# Patient Record
Sex: Male | Born: 1980
Health system: Southern US, Community
[De-identification: ages and names within clinical notes are randomized; demographics above are authoritative.]

## PROBLEM LIST (undated history)

## (undated) DIAGNOSIS — B009 Herpesviral infection, unspecified: Secondary | ICD-10-CM

## (undated) DIAGNOSIS — T7840XA Allergy, unspecified, initial encounter: Secondary | ICD-10-CM

## (undated) HISTORY — DX: Herpesviral infection, unspecified: B00.9

## (undated) HISTORY — DX: Allergy, unspecified, initial encounter: T78.40XA

---

## 2017-11-21 ENCOUNTER — Emergency Department (HOSPITAL_COMMUNITY): Payer: Self-pay

## 2017-11-21 ENCOUNTER — Emergency Department (HOSPITAL_COMMUNITY)
Admission: EM | Admit: 2017-11-21 | Discharge: 2017-11-21 | Discharge status: Against Medical Advice | Payer: Self-pay | Attending: Emergency Medicine | Admitting: Emergency Medicine

## 2017-11-21 ENCOUNTER — Encounter (HOSPITAL_COMMUNITY): Payer: Self-pay | Admitting: Neurology

## 2017-11-21 ENCOUNTER — Other Ambulatory Visit: Payer: Self-pay

## 2017-11-21 DIAGNOSIS — M25561 Pain in right knee: Secondary | ICD-10-CM | POA: Insufficient documentation

## 2017-11-21 DIAGNOSIS — Z5321 Procedure and treatment not carried out due to patient leaving prior to being seen by health care provider: Secondary | ICD-10-CM | POA: Insufficient documentation

## 2017-11-21 NOTE — ED Triage Notes (Signed)
Pt reports on Friday he fell in a man hole, c/o right knee pain, abrasion swelling to right shin. Lower back pain. Is ambulatory.

## 2017-11-21 NOTE — ED Notes (Signed)
Patient called x3 with no response °

## 2017-11-22 ENCOUNTER — Emergency Department
Admission: EM | Admit: 2017-11-22 | Discharge: 2017-11-22 | Disposition: A | Discharge status: Home / Self Care | Payer: Self-pay | Attending: Emergency Medicine | Admitting: Emergency Medicine

## 2017-11-22 ENCOUNTER — Other Ambulatory Visit: Payer: Self-pay

## 2017-11-22 ENCOUNTER — Encounter: Payer: Self-pay | Admitting: Emergency Medicine

## 2017-11-22 ENCOUNTER — Emergency Department: Payer: Self-pay

## 2017-11-22 DIAGNOSIS — Y999 Unspecified external cause status: Secondary | ICD-10-CM | POA: Insufficient documentation

## 2017-11-22 DIAGNOSIS — M545 Low back pain, unspecified: Secondary | ICD-10-CM

## 2017-11-22 DIAGNOSIS — S8991XA Unspecified injury of right lower leg, initial encounter: Secondary | ICD-10-CM | POA: Insufficient documentation

## 2017-11-22 DIAGNOSIS — W171XXA Fall into storm drain or manhole, initial encounter: Secondary | ICD-10-CM | POA: Insufficient documentation

## 2017-11-22 DIAGNOSIS — S8992XA Unspecified injury of left lower leg, initial encounter: Secondary | ICD-10-CM | POA: Insufficient documentation

## 2017-11-22 DIAGNOSIS — Y939 Activity, unspecified: Secondary | ICD-10-CM | POA: Insufficient documentation

## 2017-11-22 DIAGNOSIS — Y929 Unspecified place or not applicable: Secondary | ICD-10-CM | POA: Insufficient documentation

## 2017-11-22 DIAGNOSIS — W19XXXA Unspecified fall, initial encounter: Secondary | ICD-10-CM

## 2017-11-22 MED ORDER — CYCLOBENZAPRINE HCL 5 MG PO TABS: ORAL_TABLET | ORAL | 0 refills | Status: DC

## 2017-11-22 MED ORDER — IBUPROFEN 800 MG PO TABS: 800.0000 mg | ORAL_TABLET | Freq: Three times a day (TID) | ORAL | 0 refills | Status: DC | PRN

## 2017-11-22 NOTE — ED Provider Notes (Signed)
[facility] Emergency Department Provider Note  ____________________________________________  Time seen: Approximately 11:21 AM  I have reviewed the triage vital signs and the nursing notes.   HISTORY  Chief Complaint Fall    HPI Raymond Simmons is a 36 y.o. male that presents to the emergency department for evaluation of bilateral knee pain and low back pain after injury on Friday.  Patient states that he was going to the club in [place] when he fell in a manhole.  His right leg bent upward in his left foot leg went into the hole.  His left knee woke him up last night in his right knee woke him up the night before. His knees feel like they are grinding when he is walking. He is concerned that something is broken.  He has been taking ibuprofen for pain.  He did not hit his head or lose consciousness.  He denies nausea, vomiting, abdominal pain, numbness, tingling.  History reviewed. No pertinent past medical history.  There are no active problems to display for this patient.   History reviewed. No pertinent surgical history.  Prior to Admission medications   Medication Sig Start Date End Date Taking? Authorizing Provider  cyclobenzaprine (FLEXERIL) 5 MG tablet Take 1-2 tablets 3 times daily as needed 11/22/17   Enid Derry, PA-C  ibuprofen (ADVIL,MOTRIN) 800 MG tablet Take 1 tablet (800 mg total) by mouth every 8 (eight) hours as needed. 11/22/17   Enid Derry, PA-C    Allergies Patient has no known allergies.  No family history on file.  Social History Social History   Tobacco Use  . Smoking status: Never Smoker  . Smokeless tobacco: Never Used  Substance Use Topics  . Alcohol use: Yes  . Drug use: Not on file     Review of Systems  Cardiovascular: No chest pain. Respiratory: No SOB. Gastrointestinal: No abdominal pain.  No nausea, no vomiting.  Skin: Negative for rash, abrasions, lacerations, ecchymosis. Neurological: Negative for  headaches, numbness or tingling   ____________________________________________   PHYSICAL EXAM:  VITAL SIGNS: ED Triage Vitals  Enc Vitals Group     BP 11/22/17 1045 112/62     Pulse Rate 11/22/17 1043 74     Resp 11/22/17 1043 18     Temp 11/22/17 1043 98.6 F (37 C)     Temp Source 11/22/17 1043 Oral     SpO2 11/22/17 1043 99 %     Weight 11/22/17 1043 162 lb (73.5 kg)     Height --      Head Circumference --      Peak Flow --      Pain Score 11/22/17 1043 8     Pain Loc --      Pain Edu? --      Excl. in GC? --      Constitutional: Alert and oriented. Well appearing and in no acute distress. Eyes: Conjunctivae are normal. PERRL. EOMI. Head: Atraumatic. ENT:      Ears:      Nose: No congestion/rhinnorhea.      Mouth/Throat: Mucous membranes are moist.  Neck: No stridor.  Cardiovascular: Normal rate, regular rhythm.  Good peripheral circulation. Respiratory: Normal respiratory effort without tachypnea or retractions. Lungs CTAB. Good air entry to the bases with no decreased or absent breath sounds. Gastrointestinal: Bowel sounds 4 quadrants. Soft and nontender to palpation. No guarding or rigidity. No palpable masses. No distention.  Musculoskeletal: Full range of motion to all extremities. No gross deformities appreciated.  Mild tenderness to palpation of inferior spine. Full ROM of bilateral knees. No pinpoint tenderness to palpation of knees. No swelling or erythema to knees. Normal gait.  Neurologic:  Normal speech and language. No gross focal neurologic deficits are appreciated.  Skin:  Skin is warm, dry and intact. No rash noted.   ____________________________________________   LABS (all labs ordered are listed, but only abnormal results are displayed)  Labs Reviewed - No data to display ____________________________________________  EKG   ____________________________________________  RADIOLOGY Lexine BatonI, Annmarie Plemmons, personally viewed and evaluated these  images (plain radiographs) as part of my medical decision making, as well as reviewing the written report by the radiologist.  Dg Lumbar Spine Complete  Result Date: 11/22/2017 CLINICAL DATA:  Status post fall. EXAM: LUMBAR SPINE - COMPLETE 4+ VIEW COMPARISON:  None. FINDINGS: There are 5 nonrib bearing lumbar-type vertebral bodies. The vertebral body heights are maintained. The alignment is anatomic. There is no static listhesis. There is no spondylolysis. There is no acute fracture. There is mild degenerative disc disease with disc height loss at L5-S1. The SI joints are unremarkable. There are multiple metallic foreign bodies anterior to the sacrum likely reflecting shrapnel from prior injury. IMPRESSION: 1.  No acute osseous injury of the lumbar spine. Electronically Signed   By: Elige KoHetal  Patel   On: 11/22/2017 12:08   Dg Knee Complete 4 Views Left  Result Date: 11/22/2017 CLINICAL DATA:  Fall.  Knee pain.  Back pain. EXAM: LEFT KNEE - COMPLETE 4+ VIEW COMPARISON:  11/21/2017. FINDINGS: Mild degenerative change left knee. No evidence of fracture or dislocation. IMPRESSION: No acute abnormality. Electronically Signed   By: Maisie Fushomas  Register   On: 11/22/2017 12:05   Dg Knee Complete 4 Views Right  Result Date: 11/22/2017 CLINICAL DATA:  Fall and mandible. EXAM: RIGHT KNEE - COMPLETE 4+ VIEW COMPARISON:  11/21/2017 FINDINGS: No evidence of fracture, dislocation, or joint effusion. No evidence of arthropathy or other focal bone abnormality. Soft tissues are unremarkable. IMPRESSION: Negative. Electronically Signed   By: Signa Kellaylor  Stroud M.D.   On: 11/22/2017 12:05    ____________________________________________    PROCEDURES  Procedure(s) performed:    Procedures    Medications - No data to display   ____________________________________________   INITIAL IMPRESSION / ASSESSMENT AND PLAN / ED COURSE  Pertinent labs & imaging results that were available during my care of the patient  were reviewed by me and considered in my medical decision making (see chart for details).  Review of the [place] CSRS was performed in accordance of the [facility] prior to dispensing any controlled drugs.   Patient presented to the emergency department for evaluation of bilateral knee pain and low back pain after fall on 4 days ago.  Vital signs and exam are reassuring.  Knee x-rays and lumbar x-rays are negative for acute bony abnormalities.  Patient does not want any IM medications.  He was given crutches.  Patient will be discharged home with prescriptions for ibuprofen and Flexeril. Patient is to follow up with PCP as directed. Patient is given ED precautions to return to the ED for any worsening or new symptoms.     ____________________________________________  FINAL CLINICAL IMPRESSION(S) / ED DIAGNOSES  Final diagnoses:  Fall, initial encounter  Injury of left knee, initial encounter  Injury of right knee, initial encounter  Acute midline low back pain without sciatica      NEW MEDICATIONS STARTED DURING THIS VISIT:  ED Discharge Orders  Ordered    ibuprofen (ADVIL,MOTRIN) 800 MG tablet  Every 8 hours PRN     11/22/17 1234    cyclobenzaprine (FLEXERIL) 5 MG tablet     11/22/17 1234          This chart was dictated using voice recognition software/Dragon. Despite best efforts to proofread, errors can occur which can change the meaning. Any change was purely unintentional.    Enid DerryWagner, Arti Trang, PA-C 11/22/17 1335    Darci CurrentBrown, [place], MD 11/22/17 [phone]

## 2017-11-22 NOTE — ED Notes (Signed)
Pt returned from xray at this time.

## 2017-11-22 NOTE — ED Notes (Signed)
Patient transported to X-ray 

## 2017-11-22 NOTE — ED Triage Notes (Signed)
Pt states fell in an man hole on Friday and now having back pain and right knee pain.

## 2019-06-04 ENCOUNTER — Telehealth: Payer: Self-pay | Admitting: *Deleted

## 2019-06-04 NOTE — Telephone Encounter (Signed)
Left voicemail for him to call us back to be scheduled for his COVID-19 test at [phone] and any of the nurses could assist him.

## 2019-10-24 ENCOUNTER — Other Ambulatory Visit: Payer: Self-pay

## 2019-10-24 ENCOUNTER — Encounter: Payer: Self-pay | Admitting: *Deleted

## 2019-10-24 ENCOUNTER — Emergency Department: Payer: Self-pay

## 2019-10-24 DIAGNOSIS — X500XXA Overexertion from strenuous movement or load, initial encounter: Secondary | ICD-10-CM | POA: Insufficient documentation

## 2019-10-24 DIAGNOSIS — R0789 Other chest pain: Secondary | ICD-10-CM | POA: Insufficient documentation

## 2019-10-24 DIAGNOSIS — Y9389 Activity, other specified: Secondary | ICD-10-CM | POA: Insufficient documentation

## 2019-10-24 LAB — CBC
HCT: 37.3 % — ABNORMAL LOW (ref 39.0–52.0)
Hemoglobin: 12.1 g/dL — ABNORMAL LOW (ref 13.0–17.0)
MCH: 23.5 pg — ABNORMAL LOW (ref 26.0–34.0)
MCHC: 32.4 g/dL (ref 30.0–36.0)
MCV: 72.6 fL — ABNORMAL LOW (ref 80.0–100.0)
Platelets: 240 10*3/uL (ref 150–400)
RBC: 5.14 MIL/uL (ref 4.22–5.81)
RDW: 14.6 % (ref 11.5–15.5)
WBC: 3.9 10*3/uL — ABNORMAL LOW (ref 4.0–10.5)
nRBC: 0 % (ref 0.0–0.2)

## 2019-10-24 LAB — BASIC METABOLIC PANEL
Anion gap: 10 (ref 5–15)
BUN: 15 mg/dL (ref 6–20)
CO2: 26 mmol/L (ref 22–32)
Calcium: 8.9 mg/dL (ref 8.9–10.3)
Chloride: 104 mmol/L (ref 98–111)
Creatinine, Ser: 0.94 mg/dL (ref 0.61–1.24)
GFR calc Af Amer: >60 mL/min (ref >60)
GFR calc non Af Amer: >60 mL/min (ref >60)
Glucose, Bld: 79 mg/dL (ref 70–99)
Potassium: 3.7 mmol/L (ref 3.5–5.1)
Sodium: 140 mmol/L (ref 135–145)

## 2019-10-24 LAB — TROPONIN I (HIGH SENSITIVITY)
Troponin I (High Sensitivity): 3 ng/L (ref <18)
Troponin I (High Sensitivity): 3 ng/L (ref <18)

## 2019-10-24 MED ORDER — SODIUM CHLORIDE 0.9% FLUSH: 3.0000 mL | Freq: Once | INTRAVENOUS | Status: DC

## 2019-10-24 NOTE — ED Triage Notes (Signed)
Pt reporting central "chest tightness" for about 3-4 days, worse when he lays down. When the chest pain occurs, he also has pain in the middle of his upper back. Denies other symptoms. No cough, fevers, or SOB.

## 2019-10-24 NOTE — ED Notes (Signed)
Pt sitting in lobby talking on phone with no distress noted; pt updated on wait time & plan of care; pt voices understanding and taken to triage for repeat vs and troponin

## 2019-10-25 ENCOUNTER — Emergency Department
Admission: EM | Admit: 2019-10-25 | Discharge: 2019-10-25 | Disposition: A | Discharge status: Home / Self Care | Payer: Self-pay | Attending: Emergency Medicine | Admitting: Emergency Medicine

## 2019-10-25 DIAGNOSIS — R0789 Other chest pain: Secondary | ICD-10-CM

## 2019-10-25 LAB — HEPATIC FUNCTION PANEL
ALT: 27 U/L (ref 0–44)
AST: 29 U/L (ref 15–41)
Albumin: 3.9 g/dL (ref 3.5–5.0)
Alkaline Phosphatase: 44 U/L (ref 38–126)
Bilirubin, Direct: <0.1 mg/dL (ref 0.0–0.2)
Total Bilirubin: 0.6 mg/dL (ref 0.3–1.2)
Total Protein: 6.6 g/dL (ref 6.5–8.1)

## 2019-10-25 LAB — LIPASE, BLOOD: Lipase: 29 U/L (ref 11–51)

## 2019-10-25 NOTE — ED Provider Notes (Signed)
[facility] Emergency Department Provider Note  ____________________________________________   First MD Initiated Contact with Patient 10/25/19 0125     (approximate)  I have reviewed the triage vital signs and the nursing notes.   HISTORY  Chief Complaint Chest Pain    HPI Raymond AmisJason Simmons is a 38 y.o. male with no chronic medical issues who is generally healthy and active.  He presents for evaluation of intermittent pain in the center part of his chest over the last 3 to 4 days.  Occasionally radiates to the middle of his back.  It occurs more when he lies down after the end of his day.  It hurts a little bit when he presses on the center of his chest (his sternum).  He does not lift weights and has no known trauma but he works in [employer] and does a lot of lifting and moving.  He denies fever/chills, sore throat, shortness of breath, cough, nausea, vomiting, abdominal pain.  He has had no known COVID-19 contacts although he does work in [employer].  No history of diabetes, hypertension, obesity, blood clots in the legs of the lungs, smoking, or drug use.  He describes the symptoms as moderate and nothing in particular makes it better.  He has not tried taking any ibuprofen, Tylenol, nor aspirin.         History reviewed. No pertinent past medical history.  There are no active problems to display for this patient.   History reviewed. No pertinent surgical history.  Prior to Admission medications   Medication Sig Start Date End Date Taking? Authorizing Provider  cyclobenzaprine (FLEXERIL) 5 MG tablet Take 1-2 tablets 3 times daily as needed 11/22/17   Enid DerryWagner, Ashley, PA-C  ibuprofen (ADVIL,MOTRIN) 800 MG tablet Take 1 tablet (800 mg total) by mouth every 8 (eight) hours as needed. 11/22/17   Enid DerryWagner, Ashley, PA-C    Allergies Patient has no known allergies.  No family history on file.  Social History Social History   Tobacco Use   . Smoking status: Never Smoker  . Smokeless tobacco: Never Used  Substance Use Topics  . Alcohol use: Yes  . Drug use: Not Currently    Review of Systems Constitutional: No fever/chills Eyes: No visual changes. ENT: No sore throat. Cardiovascular: Chest pain as described above Respiratory: Denies shortness of breath. Gastrointestinal: No abdominal pain.  No nausea, no vomiting.  No diarrhea.  No constipation. Genitourinary: Negative for dysuria. Musculoskeletal: Negative for neck pain.  Negative for back pain. Integumentary: Negative for rash. Neurological: Negative for headaches, focal weakness or numbness.   ____________________________________________   PHYSICAL EXAM:  VITAL SIGNS: ED Triage Vitals  Enc Vitals Group     BP 10/24/19 2026 119/76     Pulse Rate 10/24/19 2026 61     Resp 10/24/19 2026 16     Temp 10/24/19 2026 98.9 F (37.2 C)     Temp Source 10/24/19 2026 Oral     SpO2 10/24/19 2026 100 %     Weight 10/24/19 2020 70.3 kg (155 lb)     Height 10/24/19 2020 1.753 m (5\' 9" )     Head Circumference --      Peak Flow --      Pain Score 10/24/19 2020 8     Pain Loc --      Pain Edu? --      Excl. in GC? --     Constitutional: Alert and oriented.  Healthy body habitus,  well appearing, no acute distress. Eyes: Conjunctivae are normal.  Head: Atraumatic. Nose: No congestion/rhinnorhea. Mouth/Throat: Patient is wearing a mask. Neck: No stridor.  No meningeal signs.   Cardiovascular: Normal rate, regular rhythm. Good peripheral circulation. Grossly normal heart sounds. Respiratory: Normal respiratory effort.  No retractions. Gastrointestinal: Soft and nontender. No distention.  Musculoskeletal: Mild tenderness to palpation of the sternum, easily reproducible with palpation.  No lower extremity tenderness nor edema. No gross deformities of extremities. Neurologic:  Normal speech and language. No gross focal neurologic deficits are appreciated.  Skin:  Skin  is warm, dry and intact. Psychiatric: Mood and affect are normal. Speech and behavior are normal.  ____________________________________________   LABS (all labs ordered are listed, but only abnormal results are displayed)  Labs Reviewed  CBC - Abnormal; Notable for the following components:      Result Value   WBC 3.9 (*)    Hemoglobin 12.1 (*)    HCT 37.3 (*)    MCV 72.6 (*)    MCH 23.5 (*)    All other components within normal limits  BASIC METABOLIC PANEL  HEPATIC FUNCTION PANEL  LIPASE, BLOOD  TROPONIN I (HIGH SENSITIVITY)  TROPONIN I (HIGH SENSITIVITY)   ____________________________________________  EKG  ED ECG REPORT I, Hinda Kehr, the attending physician, personally viewed and interpreted this ECG.  Date: 10/24/2019 EKG Time: 20: 22 Rate: 61 Rhythm: normal sinus rhythm QRS Axis: Right axis deviation Intervals: normal ST/T Wave abnormalities: normal Narrative Interpretation: no evidence of acute ischemia  ____________________________________________  RADIOLOGY I, Hinda Kehr, personally viewed and evaluated these images (plain radiographs) as part of my medical decision making, as well as reviewing the written report by the radiologist.  ED MD interpretation: No acute abnormalities on x-ray  Official radiology report(s): Dg Chest 2 View  Result Date: 10/24/2019 CLINICAL DATA:  Chest pain that the patient describes as "chest tightness" for 3-4 days. EXAM: CHEST - 2 VIEW COMPARISON:  None. FINDINGS: The heart size and mediastinal contours are within normal limits. Both lungs are clear. The visualized skeletal structures are unremarkable. IMPRESSION: No active cardiopulmonary disease. Electronically Signed   By: Van Clines M.D.   On: 10/24/2019 20:38    ____________________________________________   PROCEDURES   Procedure(s) performed (including Critical Care):  Procedures   ____________________________________________   INITIAL  IMPRESSION / MDM / [place] / ED COURSE  As part of my medical decision making, I reviewed the following data within the [place] notes reviewed and incorporated, Labs reviewed , EKG interpreted , Old chart reviewed, Radiograph reviewed  and Notes from prior ED visits   Differential diagnosis includes, but is not limited to, musculoskeletal chest wall pain, costochondritis, pericarditis/myocarditis, ACS, PE, pneumonia.  The patient is well-appearing and in no distress.  He has been in the ED for 5 hours and said that he feels fine.  He has had 2 - high-sensitivity troponins.  Basic metabolic panel is within normal limits.  He has a very slightly decreased white blood cell count of 3.9 but otherwise it is unremarkable.  No abnormal findings on EKG nor chest x-ray. Low risk HEAR score, PERC negative.  Reproducible sternal tenderness.  I had my usual and customary chest wall tenderness and nonspecific chest pain discussion with the patient.  I recommended ibuprofen 600 mg 3 times a day with meals for the next 5 days and will give him information about outpatient follow-up as needed.  I gave my usual customary return  precautions and he understands and agrees with the plan.   ____________________________________________  FINAL CLINICAL IMPRESSION(S) / ED DIAGNOSES  Final diagnoses:  Chest wall pain     MEDICATIONS GIVEN DURING THIS VISIT:  Medications - No data to display   ED Discharge Orders    None      *Please note:  Leodan Bolyard was evaluated in Emergency Department on 10/25/2019 for the symptoms described in the history of present illness. He was evaluated in the context of the global COVID-19 pandemic, which necessitated consideration that the patient might be at risk for infection with the SARS-CoV-2 virus that causes COVID-19. Institutional protocols and algorithms that pertain to the evaluation of patients at risk for COVID-19 are in a state  of rapid change based on information released by regulatory bodies including the CDC and federal and state organizations. These policies and algorithms were followed during the patient's care in the ED.  Some ED evaluations and interventions may be delayed as a result of limited staffing during the pandemic.*  Note:  This document was prepared using Dragon voice recognition software and may include unintentional dictation errors.   Loleta Rose, MD 10/25/19 [phone]

## 2019-10-25 NOTE — Discharge Instructions (Signed)

## 2020-02-01 ENCOUNTER — Ambulatory Visit (INDEPENDENT_AMBULATORY_CARE_PROVIDER_SITE_OTHER): Payer: BC Managed Care – PPO | Admitting: Nurse Practitioner

## 2020-02-01 ENCOUNTER — Other Ambulatory Visit: Payer: Self-pay

## 2020-02-01 VITALS — BP 102/65 | HR 63 | Temp 98.3°F | Ht 68.0 in | Wt 160.2 lb

## 2020-02-01 DIAGNOSIS — R252 Cramp and spasm: Secondary | ICD-10-CM

## 2020-02-01 DIAGNOSIS — D649 Anemia, unspecified: Secondary | ICD-10-CM | POA: Diagnosis not present

## 2020-02-01 DIAGNOSIS — D531 Other megaloblastic anemias, not elsewhere classified: Secondary | ICD-10-CM | POA: Diagnosis not present

## 2020-02-01 NOTE — Addendum Note (Signed)
Addended by: Bascom Levels on: 02/01/2020 04:48 PM   Modules accepted: Orders

## 2020-02-01 NOTE — Assessment & Plan Note (Signed)
Etiology unclear, CBC in ED about 3 months ago showed low hemoglobin, hematocrit, MCV, and MCH.  Will recheck CBC with diff today and add anemia panel.

## 2020-02-01 NOTE — Patient Instructions (Signed)
Leg Cramps Leg cramps occur when one or more muscles tighten and you have no control over this tightening (involuntary muscle contraction). Muscle cramps can develop in any muscle, but the most common place is in the calf muscles of the leg. Those cramps can occur during exercise or when you are at rest. Leg cramps are painful, and they may last for a few seconds to a few minutes. Cramps may return several times before they finally stop. Usually, leg cramps are not caused by a serious medical problem. In many cases, the cause is not known. Some common causes include:  Excessive physical effort (overexertion), such as during intense exercise.  Overuse from repetitive motions, or doing the same thing over and over.  Staying in a certain position for a long period of time.  Improper preparation, form, or technique while performing a sport or an activity.  Dehydration.  Injury.  Side effects of certain medicines.  Abnormally low levels of minerals in your blood (electrolytes), especially potassium and calcium. This could result from: ? Pregnancy. ? Taking diuretic medicines. Follow these instructions at home: Eating and drinking  Drink enough fluid to keep your urine pale yellow. Staying hydrated may help prevent cramps.  Eat a healthy diet that includes plenty of nutrients to help your muscles function. A healthy diet includes fruits and vegetables, lean protein, whole grains, and low-fat or nonfat dairy products. Managing pain, stiffness, and swelling      Try massaging, stretching, and relaxing the affected muscle. Do this for several minutes at a time.  If directed, put ice on areas that are sore or painful after a cramp: ? Put ice in a plastic bag. ? Place a towel between your skin and the bag. ? Leave the ice on for 20 minutes, 2-3 times a day.  If directed, apply heat to muscles that are tense or tight. Do this before you exercise, or as often as told by your health care  provider. Use the heat source that your health care provider recommends, such as a moist heat pack or a heating pad. ? Place a towel between your skin and the heat source. ? Leave the heat on for 20-30 minutes. ? Remove the heat if your skin turns bright red. This is especially important if you are unable to feel pain, heat, or cold. You may have a greater risk of getting burned.  Try taking hot showers or baths to help relax tight muscles. General instructions  If you are having frequent leg cramps, avoid intense exercise for several days.  Take over-the-counter and prescription medicines only as told by your health care provider.  Keep all follow-up visits as told by your health care provider. This is important. Contact a health care provider if:  Your leg cramps get more severe or more frequent, or they do not improve over time.  Your foot becomes cold, numb, or blue. Summary  Muscle cramps can develop in any muscle, but the most common place is in the calf muscles of the leg.  Leg cramps are painful, and they may last for a few seconds to a few minutes.  Usually, leg cramps are not caused by a serious medical problem. Often, the cause is not known.  Stay hydrated and take over-the-counter and prescription medicines only as told by your health care provider. This information is not intended to replace advice given to you by your health care provider. Make sure you discuss any questions you have with your health care   provider. Document Revised: 11/04/2017 Document Reviewed: 09/01/2017 Elsevier Patient Education  2020 [employer]  

## 2020-02-01 NOTE — Progress Notes (Signed)
BP 102/65 (BP Location: Left Arm, Patient Position: Sitting, Cuff Size: Normal)   Pulse 63   Temp 98.3 F (36.8 C) (Oral)   Ht 5\' 8"  (1.727 m)   Wt 160 lb 3.2 oz (72.7 kg)   SpO2 97%   BMI 24.36 kg/m    Subjective:    Patient ID: Raymond Simmons, male    DOB: 24-Feb-1981, 39 y.o.   MRN: [id number]  HPI: Raymond Simmons is a 39 y.o. male presents for new patient visit to establish care.  Introduced to 39 role and practice setting.  All questions answered.  Chief Complaint  Patient presents with  . Establish Care  . Leg Pain   LEG CRAMPS Duration: chronic Pain: yes Severity: 7/10  Quality: cramping   Location:  calves and right thigh Bilateral:  yes Onset: sudden Frequency: intermittent 2-3 Time of  day:   at random Sudden unintentional leg jerking:   no Paresthesias:   yes; in toes Decreased sensation:  no Weakness:   no Insomnia:   yes; related to working 2 jobs Fatigue:   yes Alleviating factors: taking warm baths Aggravating factors: nothing, its random times Status: fluctuating Treatments attempted: none Reports he does not drink as much water as he probably should and thinks that could be the cause of the cramping.  Patient reports he takes a lot of warm baths.   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    Depression screen [facility] 2/9 02/01/2020  Decreased Interest 0  Down, Depressed, Hopeless 0  PHQ - 2 Score 0  Altered sleeping 0  Tired, decreased energy 1  Change in appetite 1  Feeling bad or failure about yourself  0  Trouble concentrating 0  Moving slowly or fidgety/restless 0  Suicidal thoughts 0  PHQ-9 Score 2  Difficult doing work/chores Not difficult at all   No Known Allergies  Outpatient Encounter Medications as of 02/01/2020  Medication Sig  . [DISCONTINUED] cyclobenzaprine (FLEXERIL) 5 MG tablet Take 1-2 tablets 3 times daily as needed  . [DISCONTINUED]  ibuprofen (ADVIL,MOTRIN) 800 MG tablet Take 1 tablet (800 mg total) by mouth every 8 (eight) hours as needed.   No facility-administered encounter medications on file as of 02/01/2020.   Active Ambulatory Problems    Diagnosis Date Noted  . Leg cramping 02/01/2020  . Anemia 02/01/2020   Resolved Ambulatory Problems    Diagnosis Date Noted  . No Resolved Ambulatory Problems   Past Medical History:  Diagnosis Date  . Allergies   . Herpes    Past Medical History:  Diagnosis Date  . Allergies   . Herpes    History reviewed. No pertinent surgical history.  Family History  Problem Relation Age of Onset  . Prostate cancer Maternal Uncle   . HIV Mother   . Aneurysm Father   . Hypertension Maternal Grandmother   . Diabetes Maternal Grandmother     Social History   Socioeconomic History  . Marital status: Single    Spouse name: Not on file  . Number of children: Not on file  . Years of education: Not on file  . Highest education level: Not on file  Occupational History  . Not on file  Tobacco Use  . Smoking status: Former Smoker    Types: Cigarettes  . Smokeless tobacco: Never Used  Substance and Sexual Activity  . Alcohol use: Yes    Comment: Occassionally  . Drug  use: Not Currently    Types: Marijuana    Comment: Has not used Marijuana in 20 years.  . Sexual activity: Yes    Partners: Female    Birth control/protection: Condom  Other Topics Concern  . Not on file  Social History Narrative  . Not on file   Social Determinants of Health   Financial Resource Strain:   . Difficulty of Paying Living Expenses: Not on file  Food Insecurity:   . Worried About Charity fundraiser in the Last Year: Not on file  . Ran Out of Food in the Last Year: Not on file  Transportation Needs:   . Lack of Transportation (Medical): Not on file  . Lack of Transportation (Non-Medical): Not on file  Physical Activity:   . Days of Exercise per Week: Not on file  . Minutes of  Exercise per Session: Not on file  Stress:   . Feeling of Stress : Not on file  Social Connections:   . Frequency of Communication with Friends and Family: Not on file  . Frequency of Social Gatherings with Friends and Family: Not on file  . Attends Religious Services: Not on file  . Active Member of Clubs or Organizations: Not on file  . Attends Archivist Meetings: Not on file  . Marital Status: Not on file   Review of Systems  Constitutional: Positive for fatigue. Negative for activity change, appetite change and fever.  Respiratory: Negative.   Cardiovascular: Negative.   Gastrointestinal: Negative.   Genitourinary: Negative.   Musculoskeletal: Positive for myalgias. Negative for back pain, gait problem, neck pain and neck stiffness.  Skin: Negative.  Negative for color change and rash.  Neurological: Negative.  Negative for dizziness, weakness, light-headedness, numbness and headaches.  Psychiatric/Behavioral: Positive for sleep disturbance. Negative for agitation, confusion, decreased concentration and hallucinations. The patient is not nervous/anxious.    Per HPI unless specifically indicated above    Objective:    BP 102/65 (BP Location: Left Arm, Patient Position: Sitting, Cuff Size: Normal)   Pulse 63   Temp 98.3 F (36.8 C) (Oral)   Ht 5\' 8"  (1.727 m)   Wt 160 lb 3.2 oz (72.7 kg)   SpO2 97%   BMI 24.36 kg/m   Wt Readings from Last 3 Encounters:  02/01/20 160 lb 3.2 oz (72.7 kg)  10/24/19 155 lb (70.3 kg)  11/22/17 162 lb (73.5 kg)    Physical Exam Vitals and nursing note reviewed.  Constitutional:      General: He is not in acute distress.    Appearance: Normal appearance.  HENT:     Head: Normocephalic and atraumatic.  Cardiovascular:     Rate and Rhythm: Normal rate.     Pulses: Normal pulses.  Pulmonary:     Effort: Pulmonary effort is normal. No respiratory distress.     Breath sounds: Normal breath sounds. No wheezing or rhonchi.   Abdominal:     General: Abdomen is flat. Bowel sounds are normal. There is no distension.     Palpations: Abdomen is soft.     Tenderness: There is no abdominal tenderness.  Musculoskeletal:        General: No swelling. Normal range of motion.  Skin:    General: Skin is warm and dry.     Coloration: Skin is not jaundiced or pale.  Neurological:     General: No focal deficit present.     Mental Status: He is alert and oriented to person, place,  and time.     Motor: No weakness.     Gait: Gait normal.  Psychiatric:        Mood and Affect: Mood normal.        Behavior: Behavior normal.        Thought Content: Thought content normal.        Judgment: Judgment normal.       Assessment & Plan:   Problem List Items Addressed This Visit      Other   Leg cramping - Primary    Ongoing.  Will obtain metabolic panel and check complete blood count with anemia panel; last blood work ~3 months ago in ED showed anemia.  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  Encouraged to increase daily water intake to eight 8oz glasses or bottles.  Advised to go to ED with any sudden onset pain, redness, streaking, or swelling to either lower extremity.        Relevant Orders   Comprehensive metabolic panel   CBC with Differential/Platelet   US Venous Img Lower Bilateral   Anemia    Etiology unclear, CBC in ED about 3 months ago showed low hemoglobin, hematocrit, MCV, and MCH.  Will recheck CBC with diff today and add anemia panel.      Relevant Orders   Anemia panel      Follow up plan: Return in about 4 weeks (around 02/29/2020) for CPE with labs.

## 2020-02-01 NOTE — Assessment & Plan Note (Signed)
Ongoing.  Will obtain metabolic panel and check complete blood count with anemia panel; last blood work ~3 months ago in ED showed anemia.  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  Encouraged to increase daily water intake to eight 8oz glasses or bottles.  Advised to go to ED with any sudden onset pain, redness, streaking, or swelling to either lower extremity.

## 2020-02-02 ENCOUNTER — Encounter: Payer: Self-pay | Admitting: Nurse Practitioner

## 2020-02-03 LAB — CBC WITH DIFFERENTIAL/PLATELET
Basophils Absolute: 0 10*3/uL (ref 0.0–0.2)
Basos: 1 %
EOS (ABSOLUTE): 0.3 10*3/uL (ref 0.0–0.4)
Eos: 7 %
Hemoglobin: 13.7 g/dL (ref 13.0–17.7)
Immature Grans (Abs): 0 10*3/uL (ref 0.0–0.1)
Immature Granulocytes: 0 %
Lymphocytes Absolute: 1.8 10*3/uL (ref 0.7–3.1)
Lymphs: 42 %
MCH: 24.6 pg — ABNORMAL LOW (ref 26.6–33.0)
MCHC: 32.7 g/dL (ref 31.5–35.7)
MCV: 75 fL — ABNORMAL LOW (ref 79–97)
Monocytes Absolute: 0.3 10*3/uL (ref 0.1–0.9)
Monocytes: 8 %
Neutrophils Absolute: 1.8 10*3/uL (ref 1.4–7.0)
Neutrophils: 42 %
Platelets: 242 10*3/uL (ref 150–450)
RBC: 5.57 x10E6/uL (ref 4.14–5.80)
RDW: 14.8 % (ref 11.6–15.4)
WBC: 4.3 10*3/uL (ref 3.4–10.8)

## 2020-02-03 LAB — ANEMIA PANEL
Ferritin: 130 ng/mL (ref 30–400)
Folate, Hemolysate: 339 ng/mL
Folate, RBC: 809 ng/mL (ref >498)
Hematocrit: 41.9 % (ref 37.5–51.0)
Iron Saturation: 17 % (ref 15–55)
Iron: 55 ug/dL (ref 38–169)
Retic Ct Pct: 0.9 % (ref 0.6–2.6)
Total Iron Binding Capacity: 328 ug/dL (ref 250–450)
UIBC: 273 ug/dL (ref 111–343)
Vitamin B-12: 256 pg/mL (ref 232–1245)

## 2020-02-03 LAB — COMPREHENSIVE METABOLIC PANEL
ALT: 20 IU/L (ref 0–44)
AST: 21 IU/L (ref 0–40)
Albumin/Globulin Ratio: 1.9 (ref 1.2–2.2)
Albumin: 4.5 g/dL (ref 4.0–5.0)
Alkaline Phosphatase: 48 IU/L (ref 39–117)
BUN/Creatinine Ratio: 19 (ref 9–20)
BUN: 20 mg/dL (ref 6–24)
Bilirubin Total: 0.2 mg/dL (ref 0.0–1.2)
CO2: 23 mmol/L (ref 20–29)
Calcium: 9.3 mg/dL (ref 8.7–10.2)
Chloride: 104 mmol/L (ref 96–106)
Creatinine, Ser: 1.04 mg/dL (ref 0.76–1.27)
GFR calc Af Amer: 103 mL/min/{1.73_m2} (ref >59)
GFR calc non Af Amer: 89 mL/min/{1.73_m2} (ref >59)
Globulin, Total: 2.4 g/dL (ref 1.5–4.5)
Glucose: 78 mg/dL (ref 65–99)
Potassium: 4.5 mmol/L (ref 3.5–5.2)
Sodium: 140 mmol/L (ref 134–144)
Total Protein: 6.9 g/dL (ref 6.0–8.5)

## 2020-02-05 ENCOUNTER — Other Ambulatory Visit: Payer: Self-pay

## 2020-02-05 ENCOUNTER — Ambulatory Visit
Admission: RE | Admit: 2020-02-05 | Discharge: 2020-02-05 | Disposition: A | Discharge status: Home / Self Care | Payer: BC Managed Care – PPO | Source: Ambulatory Visit | Attending: Nurse Practitioner | Admitting: Nurse Practitioner

## 2020-02-05 DIAGNOSIS — R252 Cramp and spasm: Secondary | ICD-10-CM | POA: Diagnosis not present

## 2020-02-05 DIAGNOSIS — M79605 Pain in left leg: Secondary | ICD-10-CM | POA: Diagnosis not present

## 2020-02-05 DIAGNOSIS — M79604 Pain in right leg: Secondary | ICD-10-CM | POA: Diagnosis not present

## 2020-02-08 ENCOUNTER — Encounter: Payer: Self-pay | Admitting: Nurse Practitioner

## 2020-02-08 ENCOUNTER — Ambulatory Visit (INDEPENDENT_AMBULATORY_CARE_PROVIDER_SITE_OTHER): Payer: BC Managed Care – PPO | Admitting: Nurse Practitioner

## 2020-02-08 ENCOUNTER — Other Ambulatory Visit: Payer: Self-pay

## 2020-02-08 VITALS — BP 107/69 | HR 64 | Temp 98.1°F | Ht 68.0 in | Wt 161.8 lb

## 2020-02-08 DIAGNOSIS — Z0000 Encounter for general adult medical examination without abnormal findings: Secondary | ICD-10-CM

## 2020-02-08 DIAGNOSIS — Z13220 Encounter for screening for lipoid disorders: Secondary | ICD-10-CM | POA: Diagnosis not present

## 2020-02-08 DIAGNOSIS — Z1329 Encounter for screening for other suspected endocrine disorder: Secondary | ICD-10-CM

## 2020-02-08 DIAGNOSIS — Z125 Encounter for screening for malignant neoplasm of prostate: Secondary | ICD-10-CM

## 2020-02-08 DIAGNOSIS — Z139 Encounter for screening, unspecified: Secondary | ICD-10-CM | POA: Diagnosis not present

## 2020-02-08 DIAGNOSIS — Z113 Encounter for screening for infections with a predominantly sexual mode of transmission: Secondary | ICD-10-CM | POA: Diagnosis not present

## 2020-02-08 DIAGNOSIS — Z23 Encounter for immunization: Secondary | ICD-10-CM

## 2020-02-08 DIAGNOSIS — R8281 Pyuria: Secondary | ICD-10-CM | POA: Diagnosis not present

## 2020-02-08 NOTE — Patient Instructions (Addendum)
It was very nice to see you again!  We will let you know about the results of your labs on Monday.  Take care and let us know if you need anything.  Td (Tetanus, Diphtheria) Vaccine: What You Need to Know 1. Why get vaccinated? Td vaccine can prevent tetanus and diphtheria. Tetanus enters the body through cuts or wounds. Diphtheria spreads from person to person.  TETANUS (T) causes painful stiffening of the muscles. Tetanus can lead to serious health problems, including being unable to open the mouth, having trouble swallowing and breathing, or death.  DIPHTHERIA (D) can lead to difficulty breathing, heart failure, paralysis, or death. 2. Td vaccine Td is only for children 7 years and older, adolescents, and adults.  Td is usually given as a booster dose every 10 years, but it can also be given earlier after a severe and dirty wound or burn. Another vaccine, called Tdap, that protects against pertussis, also known as "whooping cough," in addition to tetanus and diphtheria, may be used instead of Td.  Td may be given at the same time as other vaccines. 3. Talk with your health care provider Tell your vaccine provider if the person getting the vaccine:  Has had an allergic reaction after a previous dose of any vaccine that protects against tetanus or diphtheria, or has any severe, life-threatening allergies.  Has ever had Guillain-Barr Syndrome (also called GBS).  Has had severe pain or swelling after a previous dose of any vaccine that protects against tetanus or diphtheria. In some cases, your health care provider may decide to postpone Td vaccination to a future visit.  People with minor illnesses, such as a cold, may be vaccinated. People who are moderately or severely ill should usually wait until they recover before getting Td vaccine.  Your health care provider can give you more information. 4. Risks of a vaccine reaction  Pain, redness, or swelling where the shot was given, mild  fever, headache, feeling tired, and nausea, vomiting, diarrhea, or stomachache sometimes happen after Td vaccine. People sometimes faint after medical procedures, including vaccination. Tell your provider if you feel dizzy or have vision changes or ringing in the ears.  As with any medicine, there is a very remote chance of a vaccine causing a severe allergic reaction, other serious injury, or death. 5. What if there is a serious problem? An allergic reaction could occur after the vaccinated person leaves the clinic. If you see signs of a severe allergic reaction (hives, swelling of the face and throat, difficulty breathing, a fast heartbeat, dizziness, or weakness), call 9-1-1 and get the person to the nearest hospital.  For other signs that concern you, call your health care provider.  Adverse reactions should be reported to the Vaccine Adverse Event Reporting System (VAERS). Your health care provider will usually file this report, or you can do it yourself. Visit the VAERS website at www.vaers.SamedayNews.es or call (380)248-3775. VAERS is only for reporting reactions, and VAERS staff do not give medical advice. 6. The National Vaccine Injury Compensation Program The [employer] Vaccine Injury Compensation Program (VICP) is a federal program that was created to compensate people who may have been injured by certain vaccines. Visit the VICP website at GoldCloset.com.ee or call (563) 192-4191 to learn about the program and about filing a claim. There is a time limit to file a claim for compensation. 7. How can I learn more?  Ask your health care provider.  Call your local or state health department.  Contact  the Centers for Disease Control and Prevention (CDC): ? Call 819-043-6066 (1-800-CDC-INFO) or ? Visit CDC's website at http://hunter.com/ Vaccine Information Statement Td Vaccine (03/07/19) This information is not intended to replace advice given to you by your health care provider.  Make sure you discuss any questions you have with your health care provider. Document Revised: 04/16/2019 Document Reviewed: 03/19/2019 Elsevier Patient Education  2020 Elsevier Inc.  Preventive Care 7-51 Years Old, Male Preventive care refers to lifestyle choices and visits with your health care provider that can promote health and wellness. This includes:  A yearly physical exam. This is also called an annual well check.  Regular dental and eye exams.  Immunizations.  Screening for certain conditions.  Healthy lifestyle choices, such as eating a healthy diet, getting regular exercise, not using drugs or products that contain nicotine and tobacco, and limiting alcohol use. What can I expect for my preventive care visit? Physical exam Your health care provider will check:  Height and weight. These may be used to calculate body mass index (BMI), which is a measurement that tells if you are at a healthy weight.  Heart rate and blood pressure.  Your skin for abnormal spots. Counseling Your health care provider may ask you questions about:  Alcohol, tobacco, and drug use.  Emotional well-being.  Home and relationship well-being.  Sexual activity.  Eating habits.  Work and work Statistician. What immunizations do I need?  Influenza (flu) vaccine  This is recommended every year. Tetanus, diphtheria, and pertussis (Tdap) vaccine  You may need a Td booster every 10 years. Varicella (chickenpox) vaccine  You may need this vaccine if you have not already been vaccinated. Zoster (shingles) vaccine  You may need this after age 108. Measles, mumps, and rubella (MMR) vaccine  You may need at least one dose of MMR if you were born in 1957 or later. You may also need a second dose. Pneumococcal conjugate (PCV13) vaccine  You may need this if you have certain conditions and were not previously vaccinated. Pneumococcal polysaccharide (PPSV23) vaccine  You may need one or  two doses if you smoke cigarettes or if you have certain conditions. Meningococcal conjugate (MenACWY) vaccine  You may need this if you have certain conditions. Hepatitis A vaccine  You may need this if you have certain conditions or if you travel or work in places where you may be exposed to hepatitis A. Hepatitis B vaccine  You may need this if you have certain conditions or if you travel or work in places where you may be exposed to hepatitis B. Haemophilus influenzae type b (Hib) vaccine  You may need this if you have certain risk factors. Human papillomavirus (HPV) vaccine  If recommended by your health care provider, you may need three doses over 6 months. You may receive vaccines as individual doses or as more than one vaccine together in one shot (combination vaccines). Talk with your health care provider about the risks and benefits of combination vaccines. What tests do I need? Blood tests  Lipid and cholesterol levels. These may be checked every 5 years, or more frequently if you are over 70 years old.  Hepatitis C test.  Hepatitis B test. Screening  Lung cancer screening. You may have this screening every year starting at age 64 if you have a 30-pack-year history of smoking and currently smoke or have quit within the past 15 years.  Prostate cancer screening. Recommendations will vary depending on your family history and other risks.  Colorectal cancer screening. All adults should have this screening starting at age 55 and continuing until age 13. Your health care provider may recommend screening at age 58 if you are at increased risk. You will have tests every 1-10 years, depending on your results and the type of screening test.  Diabetes screening. This is done by checking your blood sugar (glucose) after you have not eaten for a while (fasting). You may have this done every 1-3 years.  Sexually transmitted disease (STD) testing. Follow these instructions at  home: Eating and drinking  Eat a diet that includes fresh fruits and vegetables, whole grains, lean protein, and low-fat dairy products.  Take vitamin and mineral supplements as recommended by your health care provider.  Do not drink alcohol if your health care provider tells you not to drink.  If you drink alcohol: ? Limit how much you have to 0-2 drinks a day. ? Be aware of how much alcohol is in your drink. In the [place] one drink equals one 12 oz bottle of beer (355 mL), one 5 oz glass of wine (148 mL), or one 1 oz glass of hard liquor (44 mL). Lifestyle  Take daily care of your teeth and gums.  Stay active. Exercise for at least 30 minutes on 5 or more days each week.  Do not use any products that contain nicotine or tobacco, such as cigarettes, e-cigarettes, and chewing tobacco. If you need help quitting, ask your health care provider.  If you are sexually active, practice safe sex. Use a condom or other form of protection to prevent STIs (sexually transmitted infections).  Talk with your health care provider about taking a low-dose aspirin every day starting at age 20. What's next?  Go to your health care provider once a year for a well check visit.  Ask your health care provider how often you should have your eyes and teeth checked.  Stay up to date on all vaccines. This information is not intended to replace advice given to you by your health care provider. Make sure you discuss any questions you have with your health care provider. Document Revised: 11/16/2018 Document Reviewed: 11/16/2018 Elsevier Patient Education  2020 [employer].

## 2020-02-08 NOTE — Progress Notes (Signed)
BP 107/69 (BP Location: Left Arm, Patient Position: Sitting, Cuff Size: Normal)   Pulse 64   Temp 98.1 F (36.7 C) (Oral)   Ht 5\' 8"  (1.727 m)   Wt 161 lb 12.8 oz (73.4 kg)   SpO2 97%   BMI 24.60 kg/m    Subjective:    Patient ID: Raymond Simmons, male    DOB: 01-11-1981, 39 y.o.   MRN: [id number]  HPI: Raymond Simmons is a 39 y.o. male presenting on 02/08/2020 for comprehensive medical examination. Current medical complaints include:none  He currently lives with: grandmother.  Interim Problems from his last visit: no  Depression Screen done today and results listed below:  Depression screen [facility] 2/9 02/01/2020  Decreased Interest 0  Down, Depressed, Hopeless 0  PHQ - 2 Score 0  Altered sleeping 0  Tired, decreased energy 1  Change in appetite 1  Feeling bad or failure about yourself  0  Trouble concentrating 0  Moving slowly or fidgety/restless 0  Suicidal thoughts 0  PHQ-9 Score 2  Difficult doing work/chores Not difficult at all   The patient does not have a history of falls. I did not complete a risk assessment for falls. A plan of care for falls was not documented.  Past Medical History:  Past Medical History:  Diagnosis Date  . Allergies   . Herpes     Surgical History:  History reviewed. No pertinent surgical history.  Medications:  No current outpatient medications on file prior to visit.   No current facility-administered medications on file prior to visit.    Allergies:  No Known Allergies  Social History:  Social History   Socioeconomic History  . Marital status: Single    Spouse name: Not on file  . Number of children: Not on file  . Years of education: Not on file  . Highest education level: Not on file  Occupational History  . Not on file  Tobacco Use  . Smoking status: Former Smoker    Types: Cigarettes  . Smokeless tobacco: Never Used  Substance and Sexual Activity  . Alcohol use: Yes    Comment: Occassionally  . Drug use: Not  Currently    Types: Marijuana    Comment: Has not used Marijuana in 20 years.  . Sexual activity: Yes    Partners: Female    Birth control/protection: Condom  Other Topics Concern  . Not on file  Social History Narrative  . Not on file   Social Determinants of Health   Financial Resource Strain:   . Difficulty of Paying Living Expenses: Not on file  Food Insecurity:   . Worried About 02/03/2020 in the Last Year: Not on file  . Ran Out of Food in the Last Year: Not on file  Transportation Needs:   . Lack of Transportation (Medical): Not on file  . Lack of Transportation (Non-Medical): Not on file  Physical Activity:   . Days of Exercise per Week: Not on file  . Minutes of Exercise per Session: Not on file  Stress:   . Feeling of Stress : Not on file  Social Connections:   . Frequency of Communication with Friends and Family: Not on file  . Frequency of Social Gatherings with Friends and Family: Not on file  . Attends Religious Services: Not on file  . Active Member of Clubs or Organizations: Not on file  . Attends Programme researcher, broadcasting/film/video Meetings: Not on file  . Marital Status: Not on  file  Intimate Partner Violence:   . Fear of Current or Ex-Partner: Not on file  . Emotionally Abused: Not on file  . Physically Abused: Not on file  . Sexually Abused: Not on file   Social History   Tobacco Use  Smoking Status Former Smoker  . Types: Cigarettes  Smokeless Tobacco Never Used   Social History   Substance and Sexual Activity  Alcohol Use Yes   Comment: Occassionally    Family History:  Family History  Problem Relation Age of Onset  . Prostate cancer Maternal Uncle   . HIV Mother   . Aneurysm Father   . Hypertension Maternal Grandmother   . Diabetes Maternal Grandmother     Past medical history, surgical history, medications, allergies, family history and social history reviewed with patient today and changes made to appropriate areas of the chart.    Review of Systems  Constitutional: Negative.  Negative for fever, malaise/fatigue and weight loss.  HENT: Negative.  Negative for congestion, hearing loss, sinus pain and sore throat.   Eyes: Negative.  Negative for blurred vision and double vision.  Respiratory: Negative.  Negative for cough, shortness of breath and wheezing.   Cardiovascular: Negative.  Negative for chest pain, palpitations and leg swelling.  Gastrointestinal: Negative.  Negative for nausea and vomiting.  Genitourinary: Negative.  Negative for dysuria.  Musculoskeletal: Negative.  Negative for joint pain and myalgias.  Skin: Negative.  Negative for itching and rash.  Neurological: Negative.  Negative for dizziness, tremors, weakness and headaches.  Psychiatric/Behavioral: Negative.  Negative for substance abuse. The patient is not nervous/anxious and does not have insomnia.    All other ROS negative except what is listed above and in the HPI.      Objective:    BP 107/69 (BP Location: Left Arm, Patient Position: Sitting, Cuff Size: Normal)   Pulse 64   Temp 98.1 F (36.7 C) (Oral)   Ht 5\' 8"  (1.727 m)   Wt 161 lb 12.8 oz (73.4 kg)   SpO2 97%   BMI 24.60 kg/m   Wt Readings from Last 3 Encounters:  02/08/20 161 lb 12.8 oz (73.4 kg)  02/01/20 160 lb 3.2 oz (72.7 kg)  10/24/19 155 lb (70.3 kg)    Physical Exam Vitals and nursing note reviewed.  Constitutional:      Appearance: Normal appearance. He is normal weight.  HENT:     Head: Normocephalic.     Right Ear: Tympanic membrane, ear canal and external ear normal.     Left Ear: Tympanic membrane, ear canal and external ear normal.     Nose: Nose normal. No congestion.     Mouth/Throat:     Mouth: Mucous membranes are moist.     Pharynx: Oropharynx is clear. No oropharyngeal exudate or posterior oropharyngeal erythema.  Eyes:     General: No scleral icterus.    Extraocular Movements: Extraocular movements intact.     Conjunctiva/sclera: Conjunctivae  normal.     Pupils: Pupils are equal, round, and reactive to light.  Cardiovascular:     Rate and Rhythm: Normal rate and regular rhythm.     Heart sounds: Normal heart sounds. No murmur. No gallop.   Pulmonary:     Effort: Pulmonary effort is normal. No respiratory distress.     Breath sounds: Normal breath sounds. No wheezing or rhonchi.  Abdominal:     General: Abdomen is flat. Bowel sounds are normal. There is no distension.     Palpations: Abdomen  is soft.     Tenderness: There is no abdominal tenderness.  Musculoskeletal:        General: No swelling or tenderness. Normal range of motion.     Cervical back: Normal range of motion and neck supple. No rigidity or tenderness.     Right lower leg: No edema.     Left lower leg: No edema.  Skin:    General: Skin is warm and dry.     Coloration: Skin is not jaundiced.     Findings: No lesion.  Neurological:     General: No focal deficit present.     Mental Status: He is alert and oriented to person, place, and time. Mental status is at baseline.  Psychiatric:        Mood and Affect: Mood normal.        Behavior: Behavior normal.        Thought Content: Thought content normal.        Judgment: Judgment normal.    Results for orders placed or performed in visit on 02/01/20  Comprehensive metabolic panel  Result Value Ref Range   Glucose 78 65 - 99 mg/dL   BUN 20 6 - 24 mg/dL   Creatinine, Ser 1.04 0.76 - 1.27 mg/dL   GFR calc non Af Amer 89 >59 mL/min/1.73   GFR calc Af Amer 103 >59 mL/min/1.73   BUN/Creatinine Ratio 19 9 - 20   Sodium 140 134 - 144 mmol/L   Potassium 4.5 3.5 - 5.2 mmol/L   Chloride 104 96 - 106 mmol/L   CO2 23 20 - 29 mmol/L   Calcium 9.3 8.7 - 10.2 mg/dL   Total Protein 6.9 6.0 - 8.5 g/dL   Albumin 4.5 4.0 - 5.0 g/dL   Globulin, Total 2.4 1.5 - 4.5 g/dL   Albumin/Globulin Ratio 1.9 1.2 - 2.2   Bilirubin Total 0.2 0.0 - 1.2 mg/dL   Alkaline Phosphatase 48 39 - 117 IU/L   AST 21 0 - 40 IU/L   ALT 20 0  - 44 IU/L  CBC with Differential/Platelet  Result Value Ref Range   WBC 4.3 3.4 - 10.8 x10E3/uL   RBC 5.57 4.14 - 5.80 x10E6/uL   Hemoglobin 13.7 13.0 - 17.7 g/dL   MCV 75 (L) 79 - 97 fL   MCH 24.6 (L) 26.6 - 33.0 pg   MCHC 32.7 31.5 - 35.7 g/dL   RDW 14.8 11.6 - 15.4 %   Platelets 242 150 - 450 x10E3/uL   Neutrophils 42 Not Estab. %   Lymphs 42 Not Estab. %   Monocytes 8 Not Estab. %   Eos 7 Not Estab. %   Basos 1 Not Estab. %   Neutrophils Absolute 1.8 1.4 - 7.0 x10E3/uL   Lymphocytes Absolute 1.8 0.7 - 3.1 x10E3/uL   Monocytes Absolute 0.3 0.1 - 0.9 x10E3/uL   EOS (ABSOLUTE) 0.3 0.0 - 0.4 x10E3/uL   Basophils Absolute 0.0 0.0 - 0.2 x10E3/uL   Immature Granulocytes 0 Not Estab. %   Immature Grans (Abs) 0.0 0.0 - 0.1 x10E3/uL  Anemia panel  Result Value Ref Range   Total Iron Binding Capacity 328 250 - 450 ug/dL   UIBC 273 111 - 343 ug/dL   Iron 55 38 - 169 ug/dL   Iron Saturation 17 15 - 55 %   Vitamin B-12 256 232 - 1,245 pg/mL   Folate, Hemolysate 339.0 Not Estab. ng/mL   Hematocrit 41.9 37.5 - 51.0 %   Folate, RBC 809 >  498 ng/mL   Ferritin 130 30 - 400 ng/mL   Retic Ct Pct 0.9 0.6 - 2.6 %      Assessment & Plan:   Problem List Items Addressed This Visit    None    Visit Diagnoses    Annual physical exam    -  Primary   Relevant Orders   UA/M w/rflx Culture, Routine   Lipid Panel w/o Chol/HDL Ratio   Need for Td vaccine       Relevant Orders   Td vaccine greater than or equal to 7yo preservative free IM (Completed)   Screening for cholesterol level       Relevant Orders   Lipid Panel w/o Chol/HDL Ratio   Screening for STDs (sexually transmitted diseases)       Relevant Orders   HIV Antibody (routine testing w rflx)   HSV(herpes simplex vrs) 1+2 ab-IgG   RPR   GC/Chlamydia Probe Amp   Screening for prostate cancer       Relevant Orders   PSA   Screening for thyroid disorder       Relevant Orders   TSH      Discussed aspirin prophylaxis for  myocardial infarction prevention and decision was it was not indicated  LABORATORY TESTING:  Health maintenance labs ordered today as discussed above.   The natural history of prostate cancer and ongoing controversy regarding screening and potential treatment outcomes of prostate cancer has been discussed with the patient. The meaning of a false positive PSA and a false negative PSA has been discussed. He indicates understanding of the limitations of this screening test and wishes to proceed with screening PSA testing.  IMMUNIZATIONS:   - Tdap: Tetanus vaccination status reviewed: Td vaccination indicated and given today. - Influenza: Refused - Pneumovax: Not applicable - Prevnar: Not applicable - HPV: Not applicable - Zostavax vaccine: Not applicable  SCREENING: - Colonoscopy: Not applicable  Discussed with patient purpose of the colonoscopy is to detect colon cancer at curable precancerous or early stages   - AAA Screening: Not applicable  -Hearing Test: Not applicable  -Spirometry: Not applicable   PATIENT COUNSELING:    Sexuality: Discussed sexually transmitted diseases, partner selection, use of condoms, avoidance of unintended pregnancy  and contraceptive alternatives.   Advised to avoid cigarette smoking.  I discussed with the patient that most people either abstain from alcohol or drink within safe limits (<=14/week and <=4 drinks/occasion for males, <=7/weeks and <= 3 drinks/occasion for females) and that the risk for alcohol disorders and other health effects rises proportionally with the number of drinks per week and how often a drinker exceeds daily limits.  Discussed cessation/primary prevention of drug use and availability of treatment for abuse.   Diet: Encouraged to adjust caloric intake to maintain  or achieve ideal body weight, to reduce intake of dietary saturated fat and total fat, to limit sodium intake by avoiding high sodium foods and not adding table salt, and  to maintain adequate dietary potassium and calcium preferably from fresh fruits, vegetables, and low-fat dairy products.    Stressed the importance of regular exercise  Injury prevention: Discussed safety belts, safety helmets, smoke detector, smoking near bedding or upholstery.   Dental health: Discussed importance of regular tooth brushing, flossing, and dental visits.   Follow up plan: NEXT PREVENTATIVE PHYSICAL DUE IN 1 YEAR. Return in about 4 weeks (around 03/07/2020) for CBC check and leg cramping f/u.

## 2020-02-09 LAB — LIPID PANEL W/O CHOL/HDL RATIO
Cholesterol, Total: 159 mg/dL (ref 100–199)
HDL: 57 mg/dL (ref >39)
LDL Chol Calc (NIH): 87 mg/dL (ref 0–99)
Triglycerides: 79 mg/dL (ref 0–149)
VLDL Cholesterol Cal: 15 mg/dL (ref 5–40)

## 2020-02-09 LAB — PSA: Prostate Specific Ag, Serum: 1.4 ng/mL (ref 0.0–4.0)

## 2020-02-09 LAB — TSH: TSH: 2.04 u[IU]/mL (ref 0.450–4.500)

## 2020-02-09 LAB — HSV(HERPES SIMPLEX VRS) I + II AB-IGG
HSV 1 Glycoprotein G Ab, IgG: <0.91 index (ref 0.00–0.90)
HSV 2 IgG, Type Spec: 3.46 index — ABNORMAL HIGH (ref 0.00–0.90)

## 2020-02-09 LAB — RPR: RPR Ser Ql: NONREACTIVE

## 2020-02-09 LAB — HSV-2 IGG SUPPLEMENTAL TEST: HSV-2 IgG Supplemental Test: POSITIVE — AB

## 2020-02-09 LAB — HIV ANTIBODY (ROUTINE TESTING W REFLEX): HIV Screen 4th Generation wRfx: NONREACTIVE

## 2020-02-10 LAB — UA/M W/RFLX CULTURE, ROUTINE
Bilirubin, UA: NEGATIVE
Glucose, UA: NEGATIVE
Ketones, UA: NEGATIVE
Nitrite, UA: NEGATIVE
Protein,UA: NEGATIVE
RBC, UA: NEGATIVE
Specific Gravity, UA: >1.03 — ABNORMAL HIGH (ref 1.005–1.030)
Urobilinogen, Ur: 0.2 mg/dL (ref 0.2–1.0)
pH, UA: 5.5 (ref 5.0–7.5)

## 2020-02-10 LAB — MICROSCOPIC EXAMINATION: RBC, Urine: NONE SEEN /hpf (ref 0–2)

## 2020-02-10 LAB — URINE CULTURE, REFLEX: Organism ID, Bacteria: NO GROWTH

## 2020-02-12 LAB — GC/CHLAMYDIA PROBE AMP
Chlamydia trachomatis, NAA: NEGATIVE
Neisseria Gonorrhoeae by PCR: NEGATIVE

## 2020-04-07 ENCOUNTER — Encounter: Payer: Self-pay | Admitting: Nurse Practitioner

## 2020-04-11 ENCOUNTER — Ambulatory Visit: Payer: BC Managed Care – PPO | Admitting: Nurse Practitioner

## 2021-01-01 IMAGING — US US EXTREM LOW VENOUS
1 series · 14 of 24 positions shown · non-contrast
Comparison: None.

CLINICAL DATA: Bilateral leg cramping

EXAM:
BILATERAL LOWER EXTREMITY VENOUS DOPPLER ULTRASOUND
TECHNIQUE: Gray-scale sonography with compression, as well as color and duplex
ultrasound, were performed to evaluate the deep venous system(s)
from the level of the common femoral vein through the popliteal and
proximal calf veins.

[Series 1: us extrem low venous · 14 of 60 slices shown]
[im 1/60]
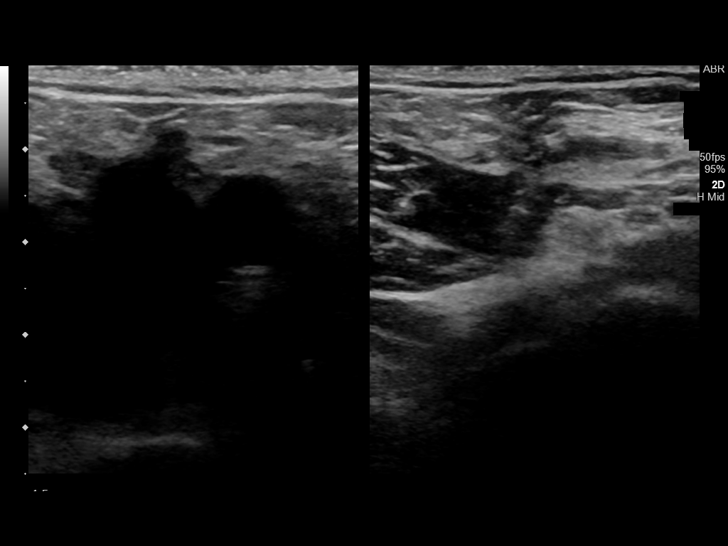
[im 6/60]
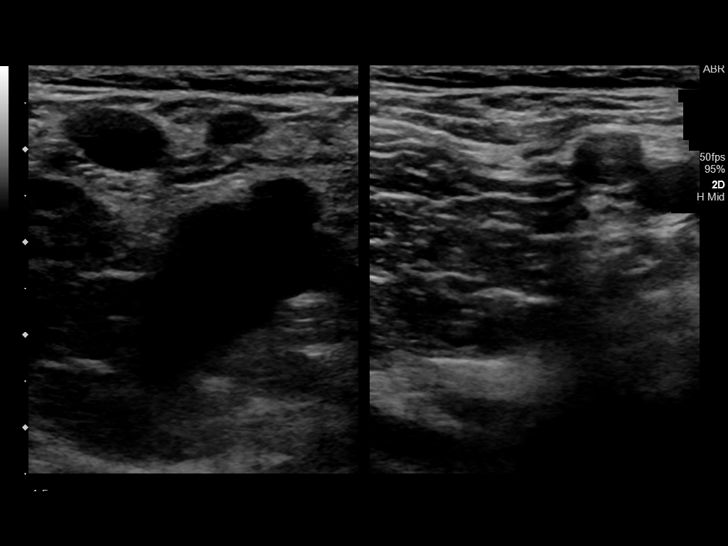
[im 11/60]
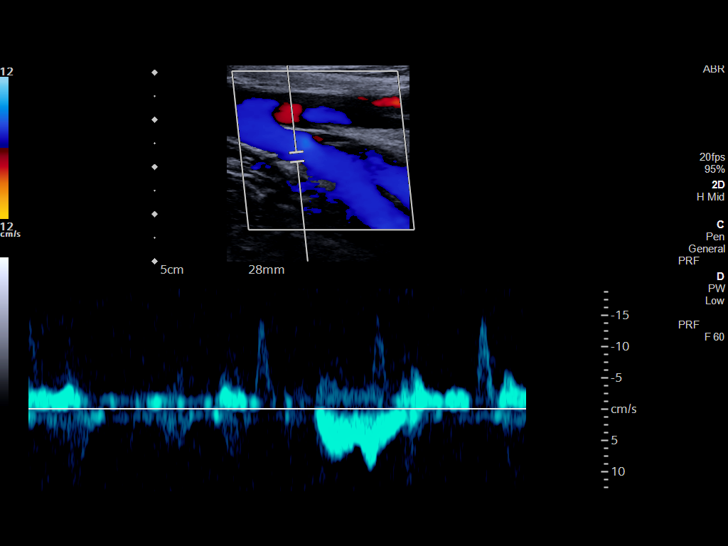
[im 16/60]
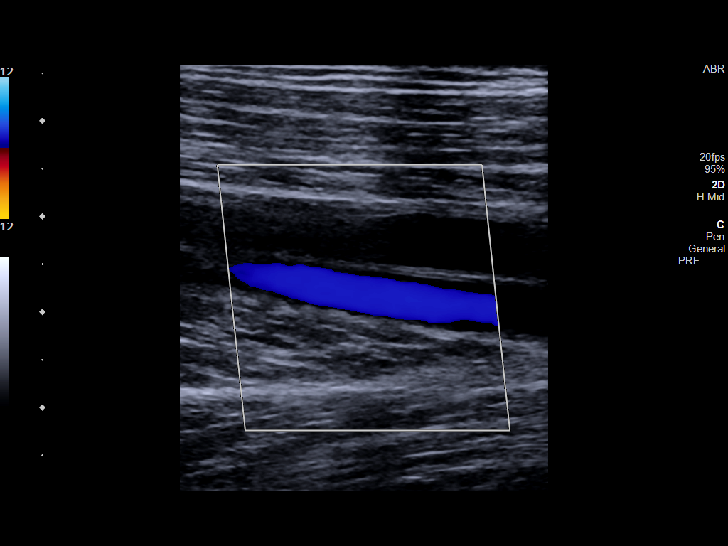
[im 18/60]
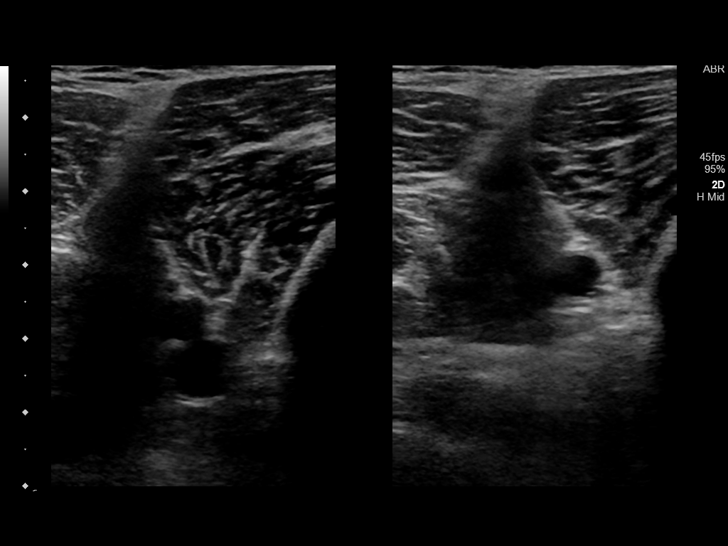
[im 24/60]
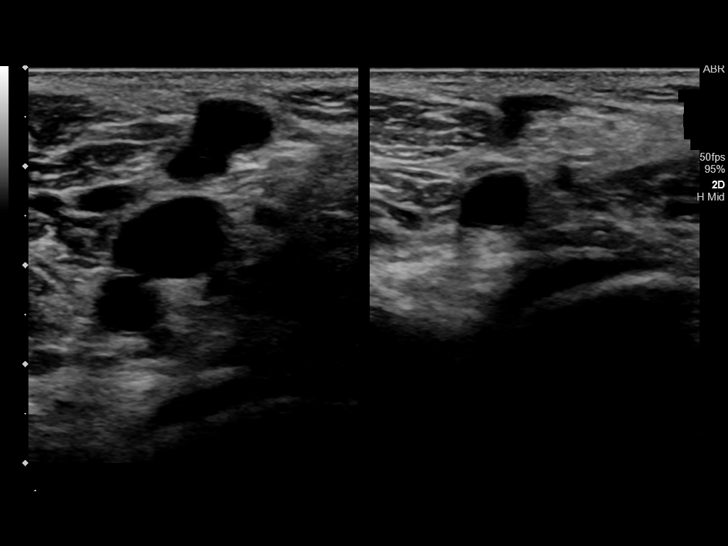
[im 29/60]
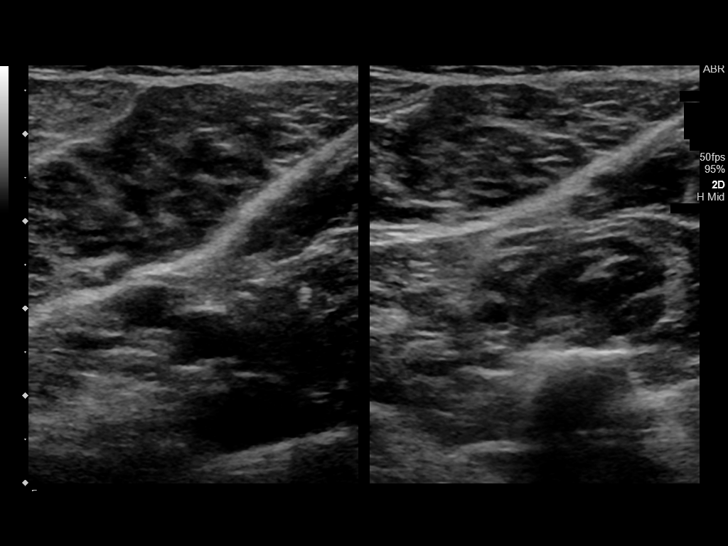
[im 31/60]
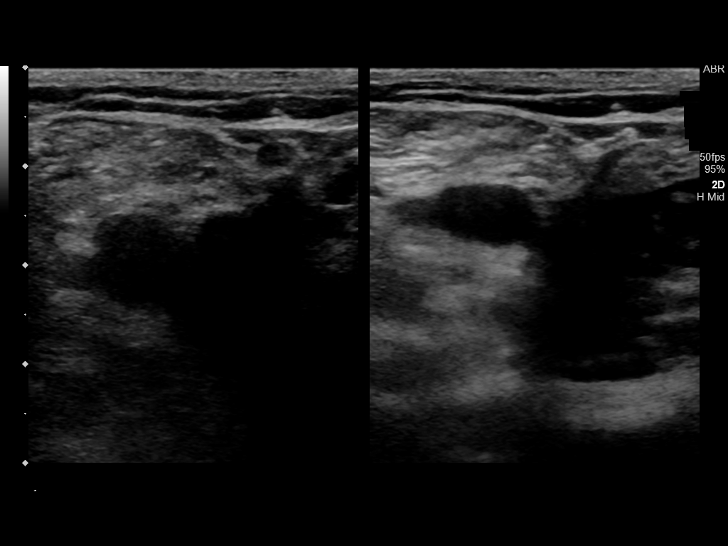
[im 36/60]
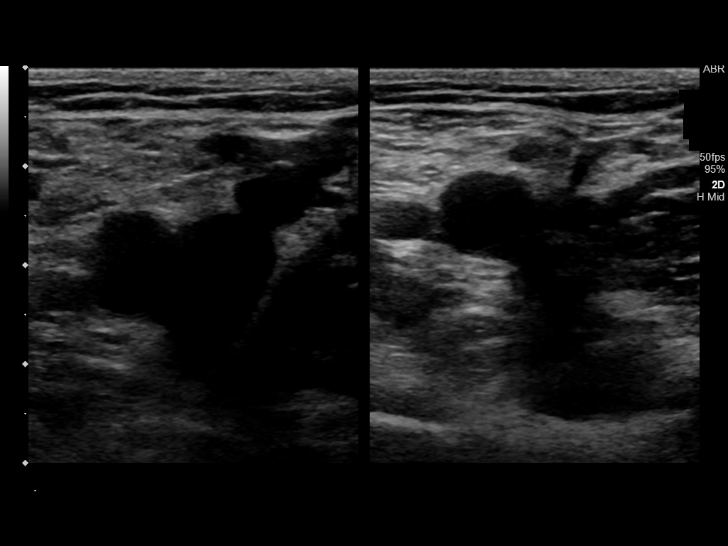
[im 42/60]
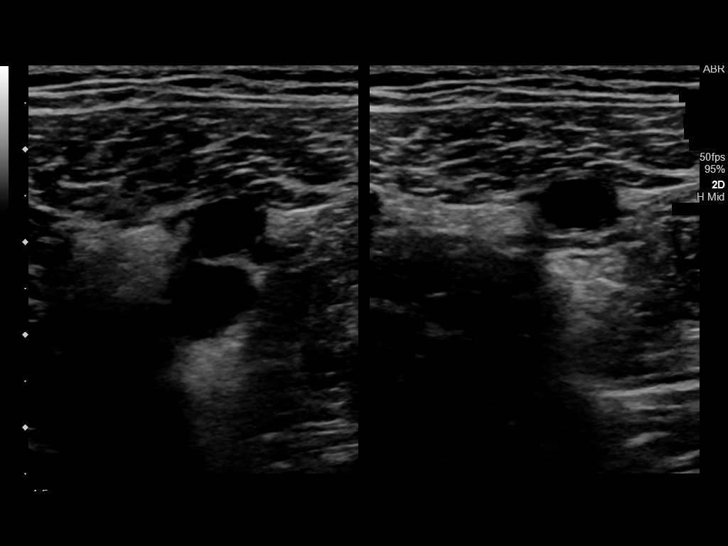
[im 47/60]
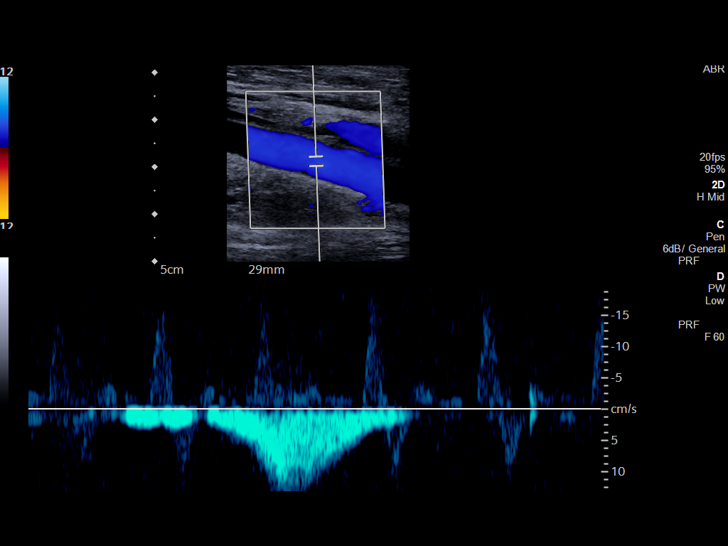
[im 49/60]
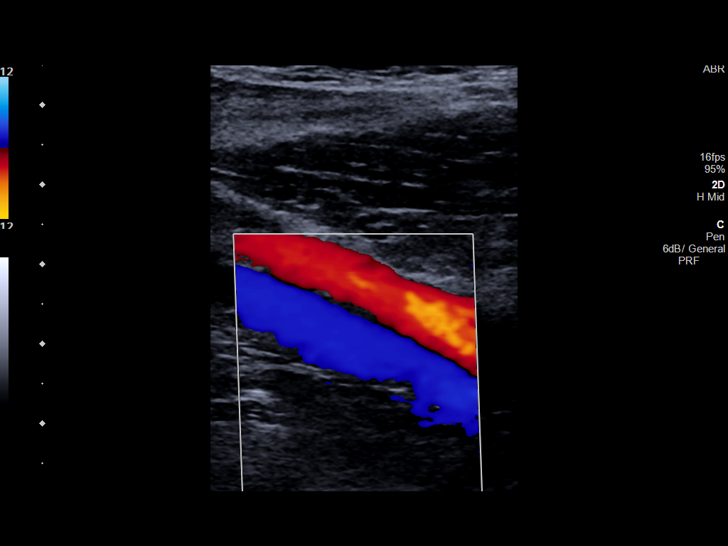
[im 54/60]
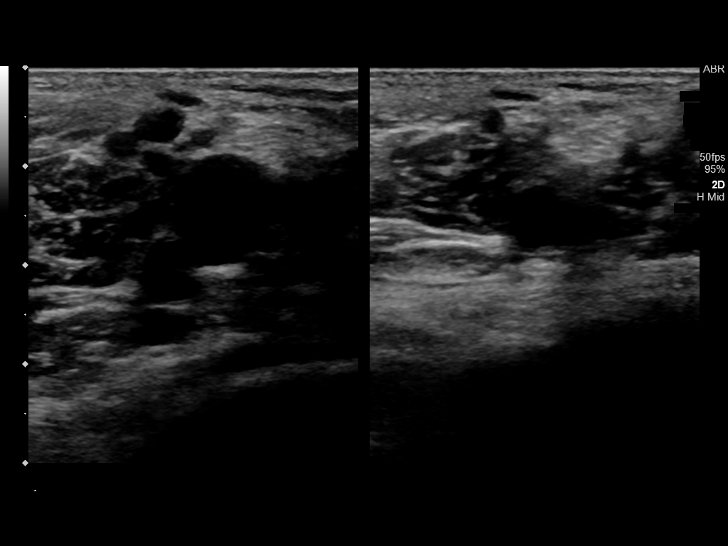
[im 60/60]
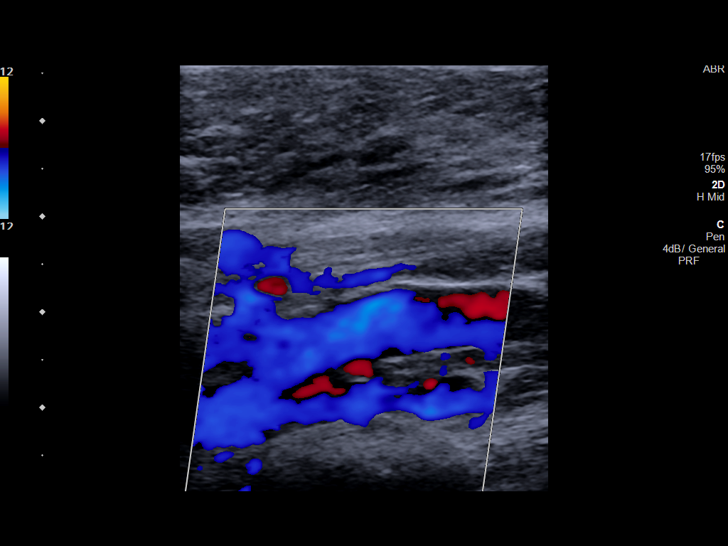

[14 of 24 positions shown; findings below may reference images not displayed]

FINDINGS: VENOUS

Normal compressibility of the common femoral, superficial femoral,
and popliteal veins, as well as the visualized calf veins.
Visualized portions of profunda femoral vein and great saphenous
vein unremarkable. No filling defects to suggest DVT on grayscale or
color Doppler imaging. Doppler waveforms show normal direction of
venous flow, normal respiratory phasicity and response to
augmentation.

OTHER

None.

Limitations: none
IMPRESSION: No evidence of lower extremity DVT.

## 2021-06-02 ENCOUNTER — Encounter: Payer: Self-pay | Admitting: Nurse Practitioner

## 2021-06-02 ENCOUNTER — Ambulatory Visit (INDEPENDENT_AMBULATORY_CARE_PROVIDER_SITE_OTHER): Payer: BC Managed Care – PPO | Admitting: Nurse Practitioner

## 2021-06-02 ENCOUNTER — Other Ambulatory Visit: Payer: Self-pay

## 2021-06-02 VITALS — BP 98/62 | HR 61 | Temp 98.4°F | Ht 68.1 in | Wt 170.6 lb

## 2021-06-02 DIAGNOSIS — Z0000 Encounter for general adult medical examination without abnormal findings: Secondary | ICD-10-CM

## 2021-06-02 DIAGNOSIS — Z136 Encounter for screening for cardiovascular disorders: Secondary | ICD-10-CM

## 2021-06-02 DIAGNOSIS — Z1159 Encounter for screening for other viral diseases: Secondary | ICD-10-CM | POA: Diagnosis not present

## 2021-06-02 DIAGNOSIS — Z20822 Contact with and (suspected) exposure to covid-19: Secondary | ICD-10-CM | POA: Diagnosis not present

## 2021-06-02 DIAGNOSIS — E538 Deficiency of other specified B group vitamins: Secondary | ICD-10-CM

## 2021-06-02 DIAGNOSIS — Z03818 Encounter for observation for suspected exposure to other biological agents ruled out: Secondary | ICD-10-CM | POA: Diagnosis not present

## 2021-06-02 DIAGNOSIS — N3001 Acute cystitis with hematuria: Secondary | ICD-10-CM | POA: Diagnosis not present

## 2021-06-02 DIAGNOSIS — Z13220 Encounter for screening for lipoid disorders: Secondary | ICD-10-CM

## 2021-06-02 DIAGNOSIS — K649 Unspecified hemorrhoids: Secondary | ICD-10-CM

## 2021-06-02 DIAGNOSIS — R350 Frequency of micturition: Secondary | ICD-10-CM | POA: Diagnosis not present

## 2021-06-02 DIAGNOSIS — R972 Elevated prostate specific antigen [PSA]: Secondary | ICD-10-CM

## 2021-06-02 LAB — MICROSCOPIC EXAMINATION: WBC, UA: >30 /hpf — ABNORMAL HIGH (ref 0–5)

## 2021-06-02 LAB — URINALYSIS, ROUTINE W REFLEX MICROSCOPIC
Bilirubin, UA: NEGATIVE
Glucose, UA: NEGATIVE
Ketones, UA: NEGATIVE
Nitrite, UA: POSITIVE — AB
Specific Gravity, UA: >1.03 — ABNORMAL HIGH (ref 1.005–1.030)
Urobilinogen, Ur: 0.2 mg/dL (ref 0.2–1.0)
pH, UA: 5.5 (ref 5.0–7.5)

## 2021-06-02 MED ORDER — NITROFURANTOIN MONOHYD MACRO 100 MG PO CAPS: 100.0000 mg | ORAL_CAPSULE | Freq: Two times a day (BID) | ORAL | 0 refills | Status: DC

## 2021-06-02 NOTE — Addendum Note (Signed)
Addended by: Rodman Pickle A on: 06/02/2021 01:15 PM   Modules accepted: Orders

## 2021-06-02 NOTE — Progress Notes (Signed)
BP 98/62   Pulse 61   Temp 98.4 F (36.9 C) (Oral)   Ht 5' 8.1" (1.73 m)   Wt 170 lb 9.6 oz (77.4 kg)   SpO2 98%   BMI 25.86 kg/m    Subjective:    Patient ID: Raymond Simmons, male    DOB: [birth year], 40 y.o.   MRN: [id number]  Chief Complaint  Patient presents with   Annual Exam    HPI: Raymond Simmons is a 40 y.o. male presenting on 06/02/2021 for comprehensive medical examination. Current medical complaints include: abdominal pain and hemmorhoids since a trip in [place] He has been using preparation H at home, wants to continue this and will let us know if his symptoms persist or worsen.   He currently lives with:girlfriend Interim Problems from his last visit: no  Depression Screen done today and results listed below:  Depression screen [facility] 2/9 06/02/2021 02/01/2020  Decreased Interest 0 0  Down, Depressed, Hopeless 0 0  PHQ - 2 Score 0 0  Altered sleeping - 0  Tired, decreased energy - 1  Change in appetite - 1  Feeling bad or failure about yourself  - 0  Trouble concentrating - 0  Moving slowly or fidgety/restless - 0  Suicidal thoughts - 0  PHQ-9 Score - 2  Difficult doing work/chores - Not difficult at all    The patient does not have a history of falls. I did not complete a risk assessment for falls. A plan of care for falls was not documented.   Past Medical History:  Past Medical History:  Diagnosis Date   Allergies    Herpes     Surgical History:  History reviewed. No pertinent surgical history.  Medications:  No current outpatient medications on file prior to visit.   No current facility-administered medications on file prior to visit.    Allergies:  No Known Allergies  Social History:  Social History   Socioeconomic History   Marital status: Single    Spouse name: Not on file   Number of children: Not on file   Years of education: Not on file   Highest education level: Not on file  Occupational History   Not on file   Tobacco Use   Smoking status: Former    Pack years: 0.00    Types: Cigarettes   Smokeless tobacco: Never  Vaping Use   Vaping Use: Never used  Substance and Sexual Activity   Alcohol use: Yes    Comment: Occassionally   Drug use: Not Currently    Types: Marijuana    Comment: Has not used Marijuana in 20 years.   Sexual activity: Yes    Partners: Female    Birth control/protection: Condom  Other Topics Concern   Not on file  Social History Narrative   Not on file   Social Determinants of Health   Financial Resource Strain: Not on file  Food Insecurity: Not on file  Transportation Needs: Not on file  Physical Activity: Not on file  Stress: Not on file  Social Connections: Not on file  Intimate Partner Violence: Not on file   Social History   Tobacco Use  Smoking Status Former   Pack years: 0.00   Types: Cigarettes  Smokeless Tobacco Never   Social History   Substance and Sexual Activity  Alcohol Use Yes   Comment: Occassionally    Family History:  Family History  Problem Relation Age of Onset   HIV Mother  Aneurysm Father    Tuberculosis Sister    Tuberculosis Sister    Prostate cancer Maternal Uncle    Hypertension Maternal Grandmother    Diabetes Maternal Grandmother     Past medical history, surgical history, medications, allergies, family history and social history reviewed with patient today and changes made to appropriate areas of the chart.   Review of Systems  Constitutional:  Positive for fever (about a week ago). Negative for diaphoresis and malaise/fatigue.  HENT: Negative.    Eyes: Negative.   Respiratory: Negative.    Cardiovascular: Negative.   Gastrointestinal:  Positive for abdominal pain (last week, however decreased) and diarrhea (started 1 week ago in [place]  Genitourinary: Negative.   Musculoskeletal: Negative.   Skin: Negative.   Neurological: Negative.   Endo/Heme/Allergies:  Positive for environmental  allergies. Negative for polydipsia. Does not bruise/bleed easily.  Psychiatric/Behavioral: Negative.    All other ROS negative except what is listed above and in the HPI.      Objective:    BP 98/62   Pulse 61   Temp 98.4 F (36.9 C) (Oral)   Ht 5' 8.1" (1.73 m)   Wt 170 lb 9.6 oz (77.4 kg)   SpO2 98%   BMI 25.86 kg/m   Wt Readings from Last 3 Encounters:  06/02/21 170 lb 9.6 oz (77.4 kg)  02/08/20 161 lb 12.8 oz (73.4 kg)  02/01/20 160 lb 3.2 oz (72.7 kg)    Physical Exam Vitals and nursing note reviewed.  Constitutional:      Appearance: Normal appearance.  HENT:     Head: Normocephalic and atraumatic.     Right Ear: Tympanic membrane, ear canal and external ear normal.     Left Ear: Tympanic membrane, ear canal and external ear normal.     Nose: Nose normal.     Mouth/Throat:     Mouth: Mucous membranes are moist.     Pharynx: Oropharynx is clear.  Eyes:     Conjunctiva/sclera: Conjunctivae normal.  Cardiovascular:     Rate and Rhythm: Normal rate and regular rhythm.     Pulses: Normal pulses.     Heart sounds: Normal heart sounds.  Pulmonary:     Effort: Pulmonary effort is normal.     Breath sounds: Normal breath sounds.  Abdominal:     General: Bowel sounds are normal.     Palpations: Abdomen is soft.     Tenderness: There is no abdominal tenderness.  Musculoskeletal:        General: Normal range of motion.     Cervical back: Normal range of motion and neck supple. No tenderness.     Comments: Strength 5/5 in bilateral upper and lower extremities   Lymphadenopathy:     Cervical: No cervical adenopathy.  Skin:    General: Skin is warm and dry.  Neurological:     General: No focal deficit present.     Mental Status: He is alert and oriented to person, place, and time.     Cranial Nerves: No cranial nerve deficit.     Gait: Gait normal.     Deep Tendon Reflexes: Reflexes normal.  Psychiatric:        Mood and Affect: Mood normal.        Behavior:  Behavior normal.        Thought Content: Thought content normal.        Judgment: Judgment normal.    Results for orders placed or performed in visit on 02/08/20  GC/Chlamydia Probe Amp   Specimen: Urine   UR  Result Value Ref Range   Chlamydia trachomatis, NAA Negative Negative   Neisseria Gonorrhoeae by PCR Negative Negative  Microscopic Examination   URINE  Result Value Ref Range   WBC, UA 6-10 (A) 0 - 5 /hpf   RBC None seen 0 - 2 /hpf   Epithelial Cells (non renal) 0-10 0 - 10 /hpf   Mucus, UA Present Not Estab.   Bacteria, UA Few (A) None seen/Few  Urine Culture, Reflex   URINE  Result Value Ref Range   Urine Culture, Routine Final report    Organism ID, Bacteria No growth   UA/M w/rflx Culture, Routine   Specimen: Urine   URINE  Result Value Ref Range   Specific Gravity, UA >1.030 (H) 1.005 - 1.030   pH, UA 5.5 5.0 - 7.5   Color, UA Yellow Yellow   Appearance Ur Clear Clear   Leukocytes,UA Trace (A) Negative   Protein,UA Negative Negative/Trace   Glucose, UA Negative Negative   Ketones, UA Negative Negative   RBC, UA Negative Negative   Bilirubin, UA Negative Negative   Urobilinogen, Ur 0.2 0.2 - 1.0 mg/dL   Nitrite, UA Negative Negative   Microscopic Examination See below:    Urinalysis Reflex Comment   Lipid Panel w/o Chol/HDL Ratio  Result Value Ref Range   Cholesterol, Total 159 100 - 199 mg/dL   Triglycerides 79 0 - 149 mg/dL   HDL 57 >39 mg/dL   VLDL Cholesterol Cal 15 5 - 40 mg/dL   LDL Chol Calc (NIH) 87 0 - 99 mg/dL  PSA  Result Value Ref Range   Prostate Specific Ag, Serum 1.4 0.0 - 4.0 ng/mL  TSH  Result Value Ref Range   TSH 2.040 0.450 - 4.500 uIU/mL  HIV Antibody (routine testing w rflx)  Result Value Ref Range   HIV Screen 4th Generation wRfx Non Reactive Non Reactive  HSV(herpes simplex vrs) 1+2 ab-IgG  Result Value Ref Range   HSV 1 Glycoprotein G Ab, IgG <0.91 0.00 - 0.90 index   HSV 2 IgG, Type Spec 3.46 (H) 0.00 - 0.90 index   RPR  Result Value Ref Range   RPR Ser Ql Non Reactive Non Reactive  HSV-2 IgG Supplemental Test  Result Value Ref Range   HSV-2 IgG Supplemental Test Positive (A) Negative      Assessment & Plan:   Problem List Items Addressed This Visit   None Visit Diagnoses     Routine general medical examination at a health care facility    -  Primary   Health maintenance reviewed and updated. Check CMP, CBC, and TSH today.    Relevant Orders   Comp Met (CMET)   CBC with Differential   TSH   Encounter for hepatitis C screening test for low risk patient       check hepatitis C today   Relevant Orders   Hepatitis C Antibody   Encounter for lipid screening for cardiovascular disease       Check lipid panel today   Relevant Orders   Lipid Panel w/o Chol/HDL Ratio   Urinary frequency       Check u/a and PSA today   Relevant Orders   PSA   Urinalysis, Routine w reflex microscopic   Vitamin B12 deficiency       check b12 level today, treat based on results   Relevant Orders   Vitamin B12   Hemorrhoids, unspecified  hemorrhoid type       Flared with diarrhea from trip to [place] Abdominal pain resolving now.Continue preparation H, can use sitz bath and witch hazel. F/U if not improve        Discussed aspirin prophylaxis for myocardial infarction prevention and decision was it was not indicated  LABORATORY TESTING:  Health maintenance labs ordered today as discussed above.   The natural history of prostate cancer and ongoing controversy regarding screening and potential treatment outcomes of prostate cancer has been discussed with the patient. The meaning of a false positive PSA and a false negative PSA has been discussed. He indicates understanding of the limitations of this screening test and wishes to proceed with screening PSA testing.    IMMUNIZATIONS:   - Tdap: Tetanus vaccination status reviewed: last tetanus booster within 10 years. - Influenza: Postponed to flu  season - Pneumovax: Not applicable - Prevnar: Not applicable - HPV: Not applicable - Zostavax vaccine: Not applicable  SCREENING: - Colonoscopy: Not applicable  Discussed with patient purpose of the colonoscopy is to detect colon cancer at curable precancerous or early stages   - AAA Screening: Not applicable  -Hearing Test: Not applicable  -Spirometry: Not applicable   PATIENT COUNSELING:    Sexuality: Discussed sexually transmitted diseases, partner selection, use of condoms, avoidance of unintended pregnancy  and contraceptive alternatives.   Advised to avoid cigarette smoking.  I discussed with the patient that most people either abstain from alcohol or drink within safe limits (<=14/week and <=4 drinks/occasion for males, <=7/weeks and <= 3 drinks/occasion for females) and that the risk for alcohol disorders and other health effects rises proportionally with the number of drinks per week and how often a drinker exceeds daily limits.  Discussed cessation/primary prevention of drug use and availability of treatment for abuse.   Diet: Encouraged to adjust caloric intake to maintain  or achieve ideal body weight, to reduce intake of dietary saturated fat and total fat, to limit sodium intake by avoiding high sodium foods and not adding table salt, and to maintain adequate dietary potassium and calcium preferably from fresh fruits, vegetables, and low-fat dairy products.    stressed the importance of regular exercise  Injury prevention: Discussed safety belts, safety helmets, smoke detector, smoking near bedding or upholstery.   Dental health: Discussed importance of regular tooth brushing, flossing, and dental visits.   Follow up plan: NEXT PREVENTATIVE PHYSICAL DUE IN 1 YEAR. Return in about 1 year (around 06/02/2022) for physical.

## 2021-06-03 ENCOUNTER — Telehealth: Payer: Self-pay

## 2021-06-03 LAB — CBC WITH DIFFERENTIAL/PLATELET
Basophils Absolute: 0 10*3/uL (ref 0.0–0.2)
Basos: 1 %
EOS (ABSOLUTE): 0.3 10*3/uL (ref 0.0–0.4)
Eos: 7 %
Hematocrit: 44.1 % (ref 37.5–51.0)
Hemoglobin: 13.3 g/dL (ref 13.0–17.7)
Immature Grans (Abs): 0 10*3/uL (ref 0.0–0.1)
Immature Granulocytes: 1 %
Lymphocytes Absolute: 1.6 10*3/uL (ref 0.7–3.1)
Lymphs: 41 %
MCH: 23.3 pg — ABNORMAL LOW (ref 26.6–33.0)
MCHC: 30.2 g/dL — ABNORMAL LOW (ref 31.5–35.7)
MCV: 77 fL — ABNORMAL LOW (ref 79–97)
Monocytes Absolute: 0.3 10*3/uL (ref 0.1–0.9)
Monocytes: 7 %
Neutrophils Absolute: 1.7 10*3/uL (ref 1.4–7.0)
Neutrophils: 43 %
Platelets: 286 10*3/uL (ref 150–450)
RBC: 5.71 x10E6/uL (ref 4.14–5.80)
RDW: 16.9 % — ABNORMAL HIGH (ref 11.6–15.4)
WBC: 3.9 10*3/uL (ref 3.4–10.8)

## 2021-06-03 LAB — COMPREHENSIVE METABOLIC PANEL
ALT: 27 IU/L (ref 0–44)
AST: 27 IU/L (ref 0–40)
Albumin/Globulin Ratio: 1.5 (ref 1.2–2.2)
Albumin: 4.3 g/dL (ref 4.0–5.0)
Alkaline Phosphatase: 59 IU/L (ref 44–121)
BUN/Creatinine Ratio: 15 (ref 9–20)
BUN: 17 mg/dL (ref 6–24)
Bilirubin Total: 0.2 mg/dL (ref 0.0–1.2)
CO2: 22 mmol/L (ref 20–29)
Calcium: 9.3 mg/dL (ref 8.7–10.2)
Chloride: 101 mmol/L (ref 96–106)
Creatinine, Ser: 1.1 mg/dL (ref 0.76–1.27)
Globulin, Total: 2.8 g/dL (ref 1.5–4.5)
Glucose: 85 mg/dL (ref 65–99)
Potassium: 4.3 mmol/L (ref 3.5–5.2)
Sodium: 140 mmol/L (ref 134–144)
Total Protein: 7.1 g/dL (ref 6.0–8.5)
eGFR: 86 mL/min/{1.73_m2} (ref >59)

## 2021-06-03 LAB — LIPID PANEL W/O CHOL/HDL RATIO
Cholesterol, Total: 143 mg/dL (ref 100–199)
HDL: 44 mg/dL (ref >39)
LDL Chol Calc (NIH): 69 mg/dL (ref 0–99)
Triglycerides: 175 mg/dL — ABNORMAL HIGH (ref 0–149)
VLDL Cholesterol Cal: 30 mg/dL (ref 5–40)

## 2021-06-03 LAB — VITAMIN B12: Vitamin B-12: 294 pg/mL (ref 232–1245)

## 2021-06-03 LAB — PSA: Prostate Specific Ag, Serum: 10.8 ng/mL — ABNORMAL HIGH (ref 0.0–4.0)

## 2021-06-03 LAB — TSH: TSH: 2.17 u[IU]/mL (ref 0.450–4.500)

## 2021-06-03 LAB — HEPATITIS C ANTIBODY: Hep C Virus Ab: <0.1 s/co ratio (ref 0.0–0.9)

## 2021-06-03 MED ORDER — LEVOFLOXACIN 500 MG PO TABS: 500.0000 mg | ORAL_TABLET | Freq: Every day | ORAL | 0 refills | Status: AC

## 2021-06-03 NOTE — Telephone Encounter (Signed)
Pt scheduled for for 07/15/2021 per lauren

## 2021-06-03 NOTE — Telephone Encounter (Signed)
Spoke with patient and notifying him of recent U/A results and that Rodman Pickle, NP sent over an antibiotic for patient to take twice a day for 5 days. Patient verbalized understanding.

## 2021-06-03 NOTE — Telephone Encounter (Signed)
I called and spoke with Barbara Cower regarding his lab results. His liver, thyroid, kidneys, and blood counts are normal. His hepatitis C was negative. His cholesterol panel was normal with the exception of slightly elevated triglycerides. His PSA significantly jumped to 10.8. With the bacteria in the urine, this is most likely related to acute bacterial prostatitis. I sent in a different antibiotic (levaquin) to take daily for 4 weeks. We will have him come back in 4-6 weeks for a lab visit to recheck his PSA.

## 2021-06-03 NOTE — Addendum Note (Signed)
Addended by: Rodman Pickle A on: 06/03/2021 12:08 PM   Modules accepted: Orders

## 2021-06-03 NOTE — Telephone Encounter (Signed)
-----   Message from Gerre Scull, NP sent at 06/02/2021  1:15 PM EDT ----- Can someone please call Brysten and let him know his U/A was positive for UTI. I sent an antibiotic for him to take twice a day for 5 days to his pharmacy [facility]). Thank you.

## 2021-06-06 LAB — URINE CULTURE

## 2021-07-15 ENCOUNTER — Other Ambulatory Visit: Payer: BC Managed Care – PPO

## 2021-11-24 NOTE — Progress Notes (Signed)
° °Established Patient Office Visit ° °Subjective:  °Patient ID: Raymond Simmons, male    DOB: 05/05/1981  Age: 40 y.o. MRN: [id number] ° °CC:  °Chief Complaint  °Patient presents with  ° Covid Exposure  °  Girlfriend tested positive yesterday. Patient states he is not having symptoms at this time. Girlfriend symptoms started on Sunday. Wants tested  ° ° °HPI °Raymond Simmons presents for follow up on elevated PSA and acute prostatitis. He was supposed to come back for a PSA recheck in August, however this was not done. He did not take antibiotics because he lost them. He denies urinary frequency, urgency, and rectal fullness.  ° °He also notes that Thursday had sinus symptoms, congestion and a cough. Home covid-19 test negative on Thursday. He took mucinex DM. His symptoms have since resolved, however his girlfriend tested positive for covid-19 earlier this week and he would like to be tested. 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  ° °Past Medical History:  °Diagnosis Date  ° Allergies   ° Herpes   ° ° °History reviewed. No pertinent surgical history. ° °Family History  °Problem Relation Age of Onset  ° HIV Mother   ° Aneurysm Father   ° Tuberculosis Sister   ° Tuberculosis Sister   ° Prostate cancer Maternal Uncle   ° Hypertension Maternal Grandmother   ° Diabetes Maternal Grandmother   ° ° °Social History  ° °Socioeconomic History  ° Marital status: Single  °  Spouse name: Not on file  ° Number of children: Not on file  ° Years of education: Not on file  ° Highest education level: Not on file  °Occupational History  ° Not on file  °Tobacco Use  ° Smoking status: Former  °  Types: Cigarettes  ° Smokeless tobacco: Never  °Vaping Use  ° Vaping Use: Never used  °Substance and Sexual Activity  ° Alcohol use: Yes  °  Comment: Occassionally  ° Drug use: Not Currently  °  Types: Marijuana  °  Comment: Has not used Marijuana in 20 years.  ° Sexual activity: Yes  °  Partners: Female  °  Birth  control/protection: Condom  °Other Topics Concern  ° Not on file  °Social History Narrative  ° Not on file  ° °Social Determinants of Health  ° °Financial Resource Strain: Not on file  °Food Insecurity: Not on file  °Transportation Needs: Not on file  °Physical Activity: Not on file  °Stress: Not on file  °Social Connections: Not on file  °Intimate Partner Violence: Not on file  ° ° °Outpatient Medications Prior to Visit  °Medication Sig Dispense Refill  ° nitrofurantoin, macrocrystal-monohydrate, (MACROBID) 100 MG capsule Take 1 capsule (100 mg total) by mouth 2 (two) times daily. 10 capsule 0  ° °No facility-administered medications prior to visit.  ° ° °No Known Allergies ° °ROS °Review of Systems  °Constitutional: Negative.   °HENT: Negative.    °Eyes: Negative.   °Respiratory: Negative.    °Cardiovascular: Negative.   °Gastrointestinal: Negative.   °Endocrine: Negative.   °Genitourinary: Negative.   °Musculoskeletal: Negative.   °Skin: Negative.   °Neurological: Negative.   °Psychiatric/Behavioral: Negative.    ° °  °Objective:  °  °Physical Exam °Vitals and nursing note reviewed.  °Constitutional:   °   Appearance: Normal appearance.  °HENT:  °   Head: Normocephalic.  °Cardiovascular:  °   Rate and Rhythm: Normal rate and regular rhythm.  °     Pulses: Normal pulses.     Heart sounds: Normal heart sounds.  Pulmonary:     Effort: Pulmonary effort is normal.     Breath sounds: Normal breath sounds.  Abdominal:     Palpations: Abdomen is soft.     Tenderness: There is no abdominal tenderness.  Musculoskeletal:     Cervical back: Normal range of motion.  Skin:    General: Skin is warm and dry.  Neurological:     General: No focal deficit present.     Mental Status: He is alert and oriented to person, place, and time.  Psychiatric:        Mood and Affect: Mood normal.        Behavior: Behavior normal.        Thought Content: Thought content normal.        Judgment: Judgment normal.    BP 111/73     Pulse 64    Temp 98.6 F (37 C) (Oral)    Ht 5' 8.11" (1.73 m)    Wt 172 lb 6.4 oz (78.2 kg)    SpO2 98%    BMI 26.13 kg/m  Wt Readings from Last 3 Encounters:  11/26/21 172 lb 6.4 oz (78.2 kg)  06/02/21 170 lb 9.6 oz (77.4 kg)  02/08/20 161 lb 12.8 oz (73.4 kg)     There are no preventive care reminders to display for this patient.   There are no preventive care reminders to display for this patient.  Lab Results  Component Value Date   TSH 2.170 06/02/2021   Lab Results  Component Value Date   WBC 3.9 06/02/2021   HGB 13.3 06/02/2021   HCT 44.1 06/02/2021   MCV 77 (L) 06/02/2021   PLT 286 06/02/2021   Lab Results  Component Value Date   NA 140 06/02/2021   K 4.3 06/02/2021   CO2 22 06/02/2021   GLUCOSE 85 06/02/2021   BUN 17 06/02/2021   CREATININE 1.10 06/02/2021   BILITOT 0.2 06/02/2021   ALKPHOS 59 06/02/2021   AST 27 06/02/2021   ALT 27 06/02/2021   PROT 7.1 06/02/2021   ALBUMIN 4.3 06/02/2021   CALCIUM 9.3 06/02/2021   ANIONGAP 10 10/24/2019   EGFR 86 06/02/2021   Lab Results  Component Value Date   CHOL 143 06/02/2021   Lab Results  Component Value Date   HDL 44 06/02/2021   Lab Results  Component Value Date   LDLCALC 69 06/02/2021   Lab Results  Component Value Date   TRIG 175 (H) 06/02/2021   No results found for: CHOLHDL No results found for: HGBA1C    Assessment & Plan:   Problem List Items Addressed This Visit   None Visit Diagnoses     Exposure to confirmed case of COVID-19    -  Primary   Will check for covid-19 today per request   Relevant Orders   Novel Coronavirus, NAA ([facility])   Elevated PSA       PSA on 06/02/21 was 10.8. He did not take the antibiotics prescribed or follow up for repeat PSA in August. Will check PSA today   Relevant Orders   PSA   CBC with Differential/Platelet   Comprehensive metabolic panel   Screen for STD (sexually transmitted disease)       STD panel today   Relevant Orders   HIV Antibody  (routine testing w rflx)   RPR   HSV(herpes simplex vrs) 1+2 ab-IgG   Acute Viral Hepatitis (  HAV, HBV, HCV)   Chlamydia/GC NAA, Confirmation       No orders of the defined types were placed in this encounter.   Follow-up: Return in about 6 months (around 05/27/2022) for CPE after 06/03/21 with Dr. Neomia Dear.    Charyl Dancer, NP

## 2021-11-26 ENCOUNTER — Ambulatory Visit: Payer: BC Managed Care – PPO | Admitting: Nurse Practitioner

## 2021-11-26 ENCOUNTER — Encounter: Payer: Self-pay | Admitting: Nurse Practitioner

## 2021-11-26 ENCOUNTER — Other Ambulatory Visit: Payer: Self-pay

## 2021-11-26 VITALS — BP 111/73 | HR 64 | Temp 98.6°F | Ht 68.11 in | Wt 172.4 lb

## 2021-11-26 DIAGNOSIS — R972 Elevated prostate specific antigen [PSA]: Secondary | ICD-10-CM | POA: Diagnosis not present

## 2021-11-26 DIAGNOSIS — Z113 Encounter for screening for infections with a predominantly sexual mode of transmission: Secondary | ICD-10-CM | POA: Diagnosis not present

## 2021-11-26 DIAGNOSIS — Z20822 Contact with and (suspected) exposure to covid-19: Secondary | ICD-10-CM | POA: Diagnosis not present

## 2021-11-27 LAB — COMPREHENSIVE METABOLIC PANEL
ALT: 37 IU/L (ref 0–44)
AST: 31 IU/L (ref 0–40)
Albumin/Globulin Ratio: 1.9 (ref 1.2–2.2)
Albumin: 4.5 g/dL (ref 4.0–5.0)
Alkaline Phosphatase: 65 IU/L (ref 44–121)
BUN/Creatinine Ratio: 12 (ref 9–20)
BUN: 13 mg/dL (ref 6–24)
Bilirubin Total: 0.3 mg/dL (ref 0.0–1.2)
CO2: 23 mmol/L (ref 20–29)
Calcium: 9.7 mg/dL (ref 8.7–10.2)
Chloride: 101 mmol/L (ref 96–106)
Creatinine, Ser: 1.11 mg/dL (ref 0.76–1.27)
Globulin, Total: 2.4 g/dL (ref 1.5–4.5)
Glucose: 91 mg/dL (ref 70–99)
Potassium: 4.3 mmol/L (ref 3.5–5.2)
Sodium: 138 mmol/L (ref 134–144)
Total Protein: 6.9 g/dL (ref 6.0–8.5)
eGFR: 85 mL/min/{1.73_m2} (ref >59)

## 2021-11-27 LAB — CBC WITH DIFFERENTIAL/PLATELET
Basophils Absolute: 0 10*3/uL (ref 0.0–0.2)
Basos: 1 %
EOS (ABSOLUTE): 0.1 10*3/uL (ref 0.0–0.4)
Eos: 4 %
Hematocrit: 43.2 % (ref 37.5–51.0)
Hemoglobin: 13.9 g/dL (ref 13.0–17.7)
Immature Grans (Abs): 0 10*3/uL (ref 0.0–0.1)
Immature Granulocytes: 0 %
Lymphocytes Absolute: 1.2 10*3/uL (ref 0.7–3.1)
Lymphs: 38 %
MCH: 24.3 pg — ABNORMAL LOW (ref 26.6–33.0)
MCHC: 32.2 g/dL (ref 31.5–35.7)
MCV: 75 fL — ABNORMAL LOW (ref 79–97)
Monocytes Absolute: 0.3 10*3/uL (ref 0.1–0.9)
Monocytes: 8 %
Neutrophils Absolute: 1.5 10*3/uL (ref 1.4–7.0)
Neutrophils: 49 %
Platelets: 303 10*3/uL (ref 150–450)
RBC: 5.73 x10E6/uL (ref 4.14–5.80)
RDW: 15 % (ref 11.6–15.4)
WBC: 3.2 10*3/uL — ABNORMAL LOW (ref 3.4–10.8)

## 2021-11-27 LAB — NOVEL CORONAVIRUS, NAA: SARS-CoV-2, NAA: NOT DETECTED

## 2021-11-27 LAB — HSV(HERPES SIMPLEX VRS) I + II AB-IGG
HSV 1 Glycoprotein G Ab, IgG: <0.91 index (ref 0.00–0.90)
HSV 2 IgG, Type Spec: 4.69 index — ABNORMAL HIGH (ref 0.00–0.90)

## 2021-11-27 LAB — ACUTE VIRAL HEPATITIS (HAV, HBV, HCV)
HCV Ab: 0.1 s/co ratio (ref 0.0–0.9)
Hep A IgM: NEGATIVE
Hep B C IgM: NEGATIVE
Hepatitis B Surface Ag: NEGATIVE

## 2021-11-27 LAB — RPR: RPR Ser Ql: NONREACTIVE

## 2021-11-27 LAB — PSA: Prostate Specific Ag, Serum: 1.5 ng/mL (ref 0.0–4.0)

## 2021-11-27 LAB — HCV INTERPRETATION

## 2021-11-27 LAB — SARS-COV-2, NAA 2 DAY TAT

## 2021-11-27 LAB — HSV-2 IGG SUPPLEMENTAL TEST: HSV-2 IgG Supplemental Test: POSITIVE — AB

## 2021-11-27 LAB — HIV ANTIBODY (ROUTINE TESTING W REFLEX): HIV Screen 4th Generation wRfx: NONREACTIVE

## 2021-12-02 LAB — CHLAMYDIA/GC NAA, CONFIRMATION
Chlamydia trachomatis, NAA: NEGATIVE
Neisseria gonorrhoeae, NAA: NEGATIVE

## 2022-07-19 ENCOUNTER — Encounter: Payer: Self-pay | Admitting: Gastroenterology

## 2022-07-19 ENCOUNTER — Ambulatory Visit (INDEPENDENT_AMBULATORY_CARE_PROVIDER_SITE_OTHER): Payer: BC Managed Care – PPO | Admitting: Gastroenterology

## 2022-07-19 VITALS — BP 124/78 | HR 72 | Temp 98.5°F | Ht 68.0 in | Wt 179.0 lb

## 2022-07-19 DIAGNOSIS — K625 Hemorrhage of anus and rectum: Secondary | ICD-10-CM | POA: Diagnosis not present

## 2022-07-19 MED ORDER — NA SULFATE-K SULFATE-MG SULF 17.5-3.13-1.6 GM/177ML PO SOLN: 1.0000 | Freq: Once | ORAL | 0 refills | Status: AC
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
## 2022-07-22 NOTE — Addendum Note (Signed)
Addended by: Roena Malady on: 07/22/2022 01:22 PM   Modules accepted: Orders

## 2022-08-05 ENCOUNTER — Encounter: Payer: Self-pay | Admitting: Gastroenterology

## 2022-08-16 ENCOUNTER — Ambulatory Visit: Payer: BC Managed Care – PPO | Admitting: Anesthesiology

## 2022-08-16 ENCOUNTER — Other Ambulatory Visit: Payer: Self-pay

## 2022-08-16 ENCOUNTER — Ambulatory Visit
Admission: RE | Admit: 2022-08-16 | Discharge: 2022-08-16 | Disposition: A | Discharge status: Home / Self Care | Payer: BC Managed Care – PPO | Attending: Gastroenterology | Admitting: Gastroenterology

## 2022-08-16 ENCOUNTER — Encounter
Admission: RE | Disposition: A | Discharge status: Home / Self Care | Payer: Self-pay | Source: Home / Self Care | Attending: Gastroenterology

## 2022-08-16 ENCOUNTER — Encounter: Payer: Self-pay | Admitting: Gastroenterology

## 2022-08-16 DIAGNOSIS — K641 Second degree hemorrhoids: Secondary | ICD-10-CM | POA: Insufficient documentation

## 2022-08-16 DIAGNOSIS — K921 Melena: Secondary | ICD-10-CM | POA: Insufficient documentation

## 2022-08-16 DIAGNOSIS — K625 Hemorrhage of anus and rectum: Secondary | ICD-10-CM

## 2022-08-16 DIAGNOSIS — R109 Unspecified abdominal pain: Secondary | ICD-10-CM | POA: Insufficient documentation

## 2022-08-16 DIAGNOSIS — Z9049 Acquired absence of other specified parts of digestive tract: Secondary | ICD-10-CM | POA: Insufficient documentation

## 2022-08-16 HISTORY — PX: COLONOSCOPY WITH PROPOFOL: SHX5780

## 2022-08-16 SURGERY — COLONOSCOPY WITH PROPOFOL
Anesthesia: Monitor Anesthesia Care

## 2022-08-16 MED ORDER — LACTATED RINGERS IV SOLN: INTRAVENOUS | Status: DC

## 2022-08-16 MED ORDER — STERILE WATER FOR IRRIGATION IR SOLN
Status: DC | PRN
  Administered 2022-08-16: 1000 mL

## 2022-08-16 MED ORDER — STERILE WATER FOR IRRIGATION IR SOLN: Status: DC | PRN

## 2022-08-16 MED ORDER — SODIUM CHLORIDE 0.9 % IV SOLN: INTRAVENOUS | Status: DC

## 2022-08-16 MED ORDER — PROPOFOL 10 MG/ML IV BOLUS
INTRAVENOUS | Status: DC | PRN
  Administered 2022-08-16: 200 mg via INTRAVENOUS
  Administered 2022-08-16: 100 mg via INTRAVENOUS

## 2022-08-16 MED ORDER — ONDANSETRON HCL 4 MG/2ML IJ SOLN: 4.0000 mg | Freq: Once | INTRAMUSCULAR | Status: DC | PRN

## 2022-08-16 MED ORDER — FENTANYL CITRATE PF 50 MCG/ML IJ SOSY: 25.0000 ug | PREFILLED_SYRINGE | INTRAMUSCULAR | Status: DC | PRN

## 2022-08-16 SURGICAL SUPPLY — 21 items

## 2022-08-16 NOTE — Anesthesia Postprocedure Evaluation (Signed)
Anesthesia Post Note  Patient: Raymond Simmons  Procedure(s) Performed: COLONOSCOPY WITH PROPOFOL     Patient location during evaluation: PACU Anesthesia Type: MAC Level of consciousness: awake and alert Pain management: pain level controlled Vital Signs Assessment: post-procedure vital signs reviewed and stable Respiratory status: spontaneous breathing, nonlabored ventilation, respiratory function stable and patient connected to nasal cannula oxygen Cardiovascular status: blood pressure returned to baseline and stable Postop Assessment: no apparent nausea or vomiting Anesthetic complications: no   No notable events documented.  Molli Barrows

## 2022-08-16 NOTE — Transfer of Care (Signed)
Immediate Anesthesia Transfer of Care Note  Patient: Raymond Simmons  Procedure(s) Performed: COLONOSCOPY WITH PROPOFOL  Patient Location: PACU  Anesthesia Type: MAC  Level of Consciousness: awake, alert  and patient cooperative  Airway and Oxygen Therapy: Patient Spontanous Breathing and Patient connected to supplemental oxygen  Post-op Assessment: Post-op Vital signs reviewed, Patient's Cardiovascular Status Stable, Respiratory Function Stable, Patent Airway and No signs of Nausea or vomiting  Post-op Vital Signs: Reviewed and stable  Complications: No notable events documented.

## 2022-08-16 NOTE — Interval H&P Note (Signed)
   Midge Minium, MD [facility] [address], [address] [place], [place] [address] Phone:[phone] Fax : [phone]  Primary Care Physician:  Pcp, No Primary Gastroenterologist:  Dr. Servando Snare  Pre-Procedure History & Physical: HPI:  Raymond Simmons is a 41 y.o. male is here for an colonoscopy.   Past Medical History:  Diagnosis Date   Allergies    Herpes     History reviewed. No pertinent surgical history.  Prior to Admission medications   Not on File    Allergies as of 07/19/2022   (No Known Allergies)    Family History  Problem Relation Age of Onset   HIV Mother    Aneurysm Father    Tuberculosis Sister    Tuberculosis Sister    Prostate cancer Maternal Uncle    Hypertension Maternal Grandmother    Diabetes Maternal Grandmother     Social History   Socioeconomic History   Marital status: Single    Spouse name: Not on file   Number of children: Not on file   Years of education: Not on file   Highest education level: Not on file  Occupational History   Not on file  Tobacco Use   Smoking status: Former    Types: Cigars   Smokeless tobacco: Never  Vaping Use   Vaping Use: Never used  Substance and Sexual Activity   Alcohol use: Yes    Comment: Occassionally   Drug use: Not Currently    Types: Marijuana    Comment: Has not used Marijuana in 20 years.   Sexual activity: Yes    Partners: Female    Birth control/protection: Condom  Other Topics Concern   Not on file  Social History Narrative   Not on file   Social Determinants of Health   Financial Resource Strain: Not on file  Food Insecurity: Not on file  Transportation Needs: Not on file  Physical Activity: Not on file  Stress: Not on file  Social Connections: Not on file  Intimate Partner Violence: Not on file    Review of Systems: See HPI, otherwise negative ROS  Physical Exam: BP 120/80   Pulse (!) 50   Temp 98.3 F (36.8 C) (Temporal)   Resp 16   Ht 5\' 8"  (1.727 m)   Wt 78 kg   SpO2  100%   BMI 26.15 kg/m  General:   Alert,  pleasant and cooperative in NAD Head:  Normocephalic and atraumatic. Neck:  Supple; no masses or thyromegaly. Lungs:  Clear throughout to auscultation.    Heart:  Regular rate and rhythm. Abdomen:  Soft, nontender and nondistended. Normal bowel sounds, without guarding, and without rebound.   Neurologic:  Alert and  oriented x4;  grossly normal neurologically.  Impression/Plan: Raymond Simmons is here for an colonoscopy to be performed for rectal bleeding  Risks, benefits, limitations, and alternatives regarding  colonoscopy have been reviewed with the patient.  Questions have been answered.  All parties agreeable.   Wonda Amis, MD  08/16/2022, 7:21 AM

## 2022-08-16 NOTE — Anesthesia Preprocedure Evaluation (Signed)
Anesthesia Evaluation  Patient identified by MRN, date of birth, ID band Patient awake    Reviewed: Allergy & Precautions, H&P , NPO status , Patient's Chart, lab work & pertinent test results, reviewed documented beta blocker date and time   Airway Mallampati: II  TM Distance: >3 FB Neck ROM: full    Dental no notable dental hx. (+) Teeth Intact   Pulmonary neg pulmonary ROS, former smoker,    Pulmonary exam normal breath sounds clear to auscultation       Cardiovascular Exercise Tolerance: Good hypertension, On Medications negative cardio ROS Normal cardiovascular exam Rhythm:regular Rate:Normal     Neuro/Psych negative neurological ROS  negative psych ROS   GI/Hepatic negative GI ROS, Neg liver ROS,   Endo/Other  negative endocrine ROSdiabetes, Well Controlled  Renal/GU negative Renal ROS  negative genitourinary   Musculoskeletal   Abdominal   Peds  Hematology  (+) Blood dyscrasia, anemia ,   Anesthesia Other Findings Past Medical History: No date: Allergies No date: Herpes History reviewed. No pertinent surgical history. BMI    Body Mass Index: 26.15 kg/m     Reproductive/Obstetrics negative OB ROS                             Anesthesia Physical Anesthesia Plan  ASA: 2  Anesthesia Plan: MAC   Post-op Pain Management:    Induction:   PONV Risk Score and Plan: 2  Airway Management Planned:   Additional Equipment:   Intra-op Plan:   Post-operative Plan:   Informed Consent: I have reviewed the patients History and Physical, chart, labs and discussed the procedure including the risks, benefits and alternatives for the proposed anesthesia with the patient or authorized representative who has indicated his/her understanding and acceptance.     Dental Advisory Given  Plan Discussed with: CRNA  Anesthesia Plan Comments:         Anesthesia Quick Evaluation

## 2022-08-16 NOTE — Op Note (Signed)
[facility] Gastroenterology Patient Name: Raymond Simmons Procedure Date: 08/16/2022 7:19 AM MRN: [id number] Account #: [id number] Date of Birth: [birth year] Admit Type: Outpatient Age: 41 Room: [facility] OR ROOM 01 Gender: Male Note Status: Finalized Instrument Name: [id number] Procedure:             Colonoscopy Indications:           Hematochezia Providers:             Midge Minium MD, MD Referring MD:          Midge Minium MD, MD (Referring MD) Medicines:             Propofol per Anesthesia Complications:         No immediate complications. Procedure:             Pre-Anesthesia Assessment:                        - Prior to the procedure, a History and Physical was                         performed, and patient medications and allergies were                         reviewed. The patient's tolerance of previous                         anesthesia was also reviewed. The risks and benefits                         of the procedure and the sedation options and risks                         were discussed with the patient. All questions were                         answered, and informed consent was obtained. Prior                         Anticoagulants: The patient has taken no previous                         anticoagulant or antiplatelet agents. ASA Grade                         Assessment: II - A patient with mild systemic disease.                         After reviewing the risks and benefits, the patient                         was deemed in satisfactory condition to undergo the                         procedure.                        After obtaining informed consent, the colonoscope was  passed under direct vision. Throughout the procedure,                         the patient's blood pressure, pulse, and oxygen                         saturations were monitored continuously. The                         Colonoscope was introduced through the anus  and                         advanced to the the cecum, identified by appendiceal                         orifice and ileocecal valve. The colonoscopy was                         performed without difficulty. The patient tolerated                         the procedure well. The quality of the bowel                         preparation was excellent. Findings:      The perianal and digital rectal examinations were normal.      Non-bleeding internal hemorrhoids were found during retroflexion. The       hemorrhoids were Grade II (internal hemorrhoids that prolapse but reduce       spontaneously). Impression:            - Non-bleeding internal hemorrhoids.                        - No specimens collected. Recommendation:        - Discharge patient to home.                        - Resume previous diet.                        - Continue present medications.                        - Repeat colonoscopy in 10 years for screening                         purposes. Procedure Code(s):     --- Professional ---                        [phone], Colonoscopy, flexible; diagnostic, including                         collection of specimen(s) by brushing or washing, when                         performed (separate procedure) Diagnosis Code(s):     --- Professional ---                        K92.1, Melena (includes Hematochezia) CPT  copyright 2019 American Medical Association. All rights reserved. The codes documented in this report are preliminary and upon coder review may  be revised to meet current compliance requirements. Midge Minium MD, MD 08/16/2022 7:41:26 AM This report has been signed electronically. Number of Addenda: 0 Note Initiated On: 08/16/2022 7:19 AM Scope Withdrawal Time: 0 hours 7 minutes 52 seconds  Total Procedure Duration: 0 hours 10 minutes 10 seconds  Estimated Blood Loss:  Estimated blood loss: none.      [facility]

## 2022-08-17 ENCOUNTER — Encounter: Payer: Self-pay | Admitting: Gastroenterology

## 2024-05-01 ENCOUNTER — Telehealth: Payer: Self-pay

## 2024-05-01 ENCOUNTER — Telehealth: Payer: Self-pay | Admitting: Gastroenterology

## 2024-05-01 NOTE — Telephone Encounter (Signed)
 The patient called to schedule an appointment for a GI issue. I informed him that Dr. Ole Berkeley is a hospitalist and no longer sees patients in the office, and that our other provider is leaving. I also advised that the next available appointments with our remaining provider are not open until August. Currently, we do not have any new patient or established patient appointments available.  I offered to have the nurse, Prentice Brochure, speak with him. The call was disconnected, so I called the patient back and left a voicemail explaining that the nurse would reach out. I advised the patient to allow 24 to 48 hours for the nurse to return his call.

## 2024-05-01 NOTE — Telephone Encounter (Signed)
 Pt called to report BRBPR following BM,pt denies rectal or abd pain. Pt stated that following previous colonoscopy, Dr Ole Berkeley let him know that he had hemorrhoids and that he could f/u with one of the other providers regarding this. Also, use of stool softners to hlp if stools are firm... I advised pt that the providers who perform the banding are no longer with [facility] and they will be going to [facility]. Information given to pt to call and set up appt. Also, advised pt to try the OTC ointment or suppositories for 2 weeks in the meantime. Pt stated that he does not have a PCP... Pt advised to call back if he has any other questions in the meantime, precautions given to pt regarding any changes in Sx

## 2024-11-28 DIAGNOSIS — N390 Urinary tract infection, site not specified: Secondary | ICD-10-CM | POA: Diagnosis not present

## 2024-11-28 DIAGNOSIS — N3289 Other specified disorders of bladder: Secondary | ICD-10-CM | POA: Diagnosis not present

## 2024-11-28 DIAGNOSIS — I10 Essential (primary) hypertension: Secondary | ICD-10-CM | POA: Diagnosis not present

## 2024-11-29 DIAGNOSIS — N3289 Other specified disorders of bladder: Secondary | ICD-10-CM | POA: Diagnosis not present
